# Patient Record
Sex: Male | Born: 1980 | Race: Black or African American | Hispanic: No | Marital: Married | Smoking: Current every day smoker
Health system: Southern US, Community
[De-identification: ages and names within clinical notes are randomized; demographics above are authoritative.]

## PROBLEM LIST (undated history)

## (undated) DIAGNOSIS — M549 Dorsalgia, unspecified: Secondary | ICD-10-CM

## (undated) DIAGNOSIS — Z23 Encounter for immunization: Secondary | ICD-10-CM

## (undated) DIAGNOSIS — R209 Unspecified disturbances of skin sensation: Secondary | ICD-10-CM

## (undated) DIAGNOSIS — G4700 Insomnia, unspecified: Secondary | ICD-10-CM

## (undated) DIAGNOSIS — R29898 Other symptoms and signs involving the musculoskeletal system: Secondary | ICD-10-CM

## (undated) HISTORY — DX: Unspecified disturbances of skin sensation: R20.9

## (undated) HISTORY — DX: Other symptoms and signs involving the musculoskeletal system: R29.898

## (undated) HISTORY — DX: Insomnia, unspecified: G47.00

## (undated) HISTORY — DX: Encounter for immunization: Z23

## (undated) HISTORY — DX: Dorsalgia, unspecified: M54.9

---

## 2006-08-02 ENCOUNTER — Emergency Department (HOSPITAL_COMMUNITY): Admission: EM | Admit: 2006-08-02 | Discharge: 2006-08-02 | Payer: Self-pay | Admitting: *Deleted

## 2007-02-22 ENCOUNTER — Emergency Department (HOSPITAL_COMMUNITY): Admission: EM | Admit: 2007-02-22 | Discharge: 2007-02-22 | Payer: Self-pay | Admitting: Emergency Medicine

## 2007-05-07 ENCOUNTER — Emergency Department (HOSPITAL_COMMUNITY): Admission: EM | Admit: 2007-05-07 | Discharge: 2007-05-07 | Payer: Self-pay | Admitting: Emergency Medicine

## 2007-06-04 ENCOUNTER — Emergency Department (HOSPITAL_COMMUNITY): Admission: EM | Admit: 2007-06-04 | Discharge: 2007-06-04 | Payer: Self-pay | Admitting: Emergency Medicine

## 2007-12-07 ENCOUNTER — Emergency Department (HOSPITAL_COMMUNITY): Admission: EM | Admit: 2007-12-07 | Discharge: 2007-12-07 | Payer: Self-pay | Admitting: Emergency Medicine

## 2009-07-01 ENCOUNTER — Emergency Department (HOSPITAL_COMMUNITY): Admission: EM | Admit: 2009-07-01 | Discharge: 2009-07-01 | Payer: Self-pay | Admitting: Emergency Medicine

## 2009-08-07 ENCOUNTER — Emergency Department (HOSPITAL_COMMUNITY): Admission: EM | Admit: 2009-08-07 | Discharge: 2009-08-08 | Payer: Self-pay | Admitting: Emergency Medicine

## 2009-11-24 ENCOUNTER — Emergency Department (HOSPITAL_COMMUNITY): Admission: EM | Admit: 2009-11-24 | Discharge: 2009-11-24 | Payer: Self-pay | Admitting: Emergency Medicine

## 2010-07-02 LAB — GLUCOSE, CAPILLARY: Glucose-Capillary: 94 mg/dL (ref 70–99)

## 2010-07-07 LAB — DIFFERENTIAL
Basophils Absolute: 0 10*3/uL (ref 0.0–0.1)
Basophils Relative: 0 % (ref 0–1)
Neutro Abs: 5.9 10*3/uL (ref 1.7–7.7)
Neutrophils Relative %: 81 % — ABNORMAL HIGH (ref 43–77)

## 2010-07-07 LAB — CBC
MCHC: 32.6 g/dL (ref 30.0–36.0)
RBC: 4.95 MIL/uL (ref 4.22–5.81)
RDW: 13.1 % (ref 11.5–15.5)

## 2011-01-02 LAB — URINALYSIS, ROUTINE W REFLEX MICROSCOPIC: Specific Gravity, Urine: 1.028

## 2011-01-03 LAB — GC/CHLAMYDIA PROBE AMP, GENITAL
Chlamydia, DNA Probe: NEGATIVE
GC Probe Amp, Genital: NEGATIVE

## 2011-01-03 LAB — URINALYSIS, ROUTINE W REFLEX MICROSCOPIC
Bilirubin Urine: NEGATIVE
Glucose, UA: NEGATIVE
Ketones, ur: NEGATIVE
Nitrite: NEGATIVE
Specific Gravity, Urine: 1.025
pH: 7

## 2011-01-03 LAB — URINE MICROSCOPIC-ADD ON

## 2011-01-07 ENCOUNTER — Emergency Department (HOSPITAL_COMMUNITY)
Admission: EM | Admit: 2011-01-07 | Discharge: 2011-01-10 | Disposition: A | Discharge status: Home / Self Care | Payer: Self-pay | Attending: Internal Medicine | Admitting: Internal Medicine

## 2011-01-07 DIAGNOSIS — R443 Hallucinations, unspecified: Secondary | ICD-10-CM | POA: Insufficient documentation

## 2011-01-07 DIAGNOSIS — R45850 Homicidal ideations: Secondary | ICD-10-CM | POA: Insufficient documentation

## 2011-01-07 DIAGNOSIS — L02619 Cutaneous abscess of unspecified foot: Secondary | ICD-10-CM | POA: Insufficient documentation

## 2011-01-07 LAB — URINALYSIS, ROUTINE W REFLEX MICROSCOPIC
Glucose, UA: NEGATIVE mg/dL
Hgb urine dipstick: NEGATIVE
Ketones, ur: >80 mg/dL — AB
Leukocytes, UA: NEGATIVE
Protein, ur: NEGATIVE mg/dL
Urobilinogen, UA: 1 mg/dL (ref 0.0–1.0)

## 2011-01-07 LAB — DIFFERENTIAL
Basophils Absolute: 0 10*3/uL (ref 0.0–0.1)
Basophils Relative: 0 % (ref 0–1)
Lymphocytes Relative: 7 % — ABNORMAL LOW (ref 12–46)
Neutro Abs: 15.1 10*3/uL — ABNORMAL HIGH (ref 1.7–7.7)
Neutrophils Relative %: 86 % — ABNORMAL HIGH (ref 43–77)

## 2011-01-07 LAB — CBC
HCT: 41 % (ref 39.0–52.0)
Hemoglobin: 14.6 g/dL (ref 13.0–17.0)
RBC: 4.83 MIL/uL (ref 4.22–5.81)

## 2011-01-07 LAB — RAPID URINE DRUG SCREEN, HOSP PERFORMED
Amphetamines: NOT DETECTED
Opiates: NOT DETECTED
Tetrahydrocannabinol: NOT DETECTED

## 2011-01-08 LAB — ETHANOL: Alcohol, Ethyl (B): <11 mg/dL (ref 0–11)

## 2011-01-08 LAB — BASIC METABOLIC PANEL
CO2: 25 mEq/L (ref 19–32)
Chloride: 101 mEq/L (ref 96–112)
Glucose, Bld: 111 mg/dL — ABNORMAL HIGH (ref 70–99)
Potassium: 3.5 mEq/L (ref 3.5–5.1)
Sodium: 137 mEq/L (ref 135–145)

## 2011-01-09 ENCOUNTER — Emergency Department (HOSPITAL_COMMUNITY): Payer: Self-pay

## 2011-01-09 ENCOUNTER — Encounter (HOSPITAL_COMMUNITY): Payer: Self-pay

## 2011-01-09 LAB — URINALYSIS, ROUTINE W REFLEX MICROSCOPIC
Glucose, UA: NEGATIVE mg/dL
Hgb urine dipstick: NEGATIVE
Leukocytes, UA: NEGATIVE
Protein, ur: NEGATIVE mg/dL
Specific Gravity, Urine: 1.018 (ref 1.005–1.030)
pH: 7 (ref 5.0–8.0)

## 2011-01-09 LAB — URINE MICROSCOPIC-ADD ON

## 2011-01-21 LAB — COMPREHENSIVE METABOLIC PANEL
ALT: 23
Calcium: 9
Glucose, Bld: 104 — ABNORMAL HIGH
Sodium: 139
Total Protein: 7.1

## 2011-01-21 LAB — OCCULT BLOOD X 1 CARD TO LAB, STOOL: Fecal Occult Bld: NEGATIVE

## 2011-01-21 LAB — CBC
Hemoglobin: 16.3
MCHC: 34
RDW: 12.1

## 2011-01-21 LAB — URINALYSIS, ROUTINE W REFLEX MICROSCOPIC
Hgb urine dipstick: NEGATIVE
Nitrite: NEGATIVE
Protein, ur: NEGATIVE
Urobilinogen, UA: 0.2

## 2011-01-21 LAB — TYPE AND SCREEN
ABO/RH(D): O POS
Antibody Screen: NEGATIVE

## 2011-01-21 LAB — PROTIME-INR
INR: 0.9
Prothrombin Time: 11.8

## 2011-01-21 LAB — DIFFERENTIAL
Eosinophils Absolute: 0 — ABNORMAL LOW
Lymphs Abs: 0.3 — ABNORMAL LOW
Monocytes Relative: 4
Neutro Abs: 12.8 — ABNORMAL HIGH
Neutrophils Relative %: 91 — ABNORMAL HIGH

## 2013-01-06 ENCOUNTER — Encounter: Payer: Self-pay | Admitting: Neurology

## 2013-01-07 ENCOUNTER — Ambulatory Visit: Payer: 59 | Admitting: Neurology

## 2013-04-04 ENCOUNTER — Encounter (HOSPITAL_COMMUNITY): Payer: Self-pay | Admitting: Emergency Medicine

## 2013-04-04 ENCOUNTER — Emergency Department (HOSPITAL_COMMUNITY)
Admission: EM | Admit: 2013-04-04 | Discharge: 2013-04-05 | Disposition: A | Discharge status: Home / Self Care | Payer: Self-pay | Attending: Emergency Medicine | Admitting: Emergency Medicine

## 2013-04-04 ENCOUNTER — Emergency Department (HOSPITAL_COMMUNITY): Payer: Self-pay

## 2013-04-04 DIAGNOSIS — J209 Acute bronchitis, unspecified: Secondary | ICD-10-CM | POA: Insufficient documentation

## 2013-04-04 DIAGNOSIS — J40 Bronchitis, not specified as acute or chronic: Secondary | ICD-10-CM

## 2013-04-04 DIAGNOSIS — IMO0001 Reserved for inherently not codable concepts without codable children: Secondary | ICD-10-CM | POA: Insufficient documentation

## 2013-04-04 DIAGNOSIS — K2920 Alcoholic gastritis without bleeding: Secondary | ICD-10-CM

## 2013-04-04 DIAGNOSIS — K920 Hematemesis: Secondary | ICD-10-CM | POA: Insufficient documentation

## 2013-04-04 DIAGNOSIS — R319 Hematuria, unspecified: Secondary | ICD-10-CM | POA: Insufficient documentation

## 2013-04-04 DIAGNOSIS — F17200 Nicotine dependence, unspecified, uncomplicated: Secondary | ICD-10-CM | POA: Insufficient documentation

## 2013-04-04 LAB — URINALYSIS, ROUTINE W REFLEX MICROSCOPIC
Bilirubin Urine: NEGATIVE
Glucose, UA: NEGATIVE mg/dL
Hgb urine dipstick: NEGATIVE
Ketones, ur: NEGATIVE mg/dL
Protein, ur: NEGATIVE mg/dL

## 2013-04-04 LAB — CBC WITH DIFFERENTIAL/PLATELET
Basophils Absolute: 0 10*3/uL (ref 0.0–0.1)
Basophils Relative: 0 % (ref 0–1)
Eosinophils Relative: 2 % (ref 0–5)
HCT: 41.2 % (ref 39.0–52.0)
MCHC: 35.4 g/dL (ref 30.0–36.0)
MCV: 88 fL (ref 78.0–100.0)
Monocytes Absolute: 0.5 10*3/uL (ref 0.1–1.0)
RDW: 13.6 % (ref 11.5–15.5)

## 2013-04-04 LAB — COMPREHENSIVE METABOLIC PANEL
AST: 27 U/L (ref 0–37)
Albumin: 3.8 g/dL (ref 3.5–5.2)
BUN: 8 mg/dL (ref 6–23)
CO2: 25 mEq/L (ref 19–32)
Calcium: 9 mg/dL (ref 8.4–10.5)
Creatinine, Ser: 1.06 mg/dL (ref 0.50–1.35)
GFR calc non Af Amer: >90 mL/min (ref >90)

## 2013-04-04 LAB — LIPASE, BLOOD: Lipase: 24 U/L (ref 11–59)

## 2013-04-04 LAB — OCCULT BLOOD, POC DEVICE: Fecal Occult Bld: NEGATIVE

## 2013-04-04 MED ORDER — ONDANSETRON HCL 4 MG/2ML IJ SOLN
4.0000 mg | Freq: Once | INTRAMUSCULAR | Status: AC
  Administered 2013-04-04: 4 mg via INTRAVENOUS
  Filled 2013-04-04: qty 2

## 2013-04-04 MED ORDER — PANTOPRAZOLE SODIUM 40 MG IV SOLR
40.0000 mg | Freq: Once | INTRAVENOUS | Status: AC
  Administered 2013-04-04: 40 mg via INTRAVENOUS
  Filled 2013-04-04: qty 40

## 2013-04-04 MED ORDER — SODIUM CHLORIDE 0.9 % IV SOLN
Freq: Once | INTRAVENOUS | Status: AC
  Administered 2013-04-04: 21:00:00 via INTRAVENOUS

## 2013-04-04 NOTE — ED Notes (Signed)
Pt complains of a cough and congestion for two weeks, he states that he's had blood in his urine a few times and has also vomited blood

## 2013-04-05 MED ORDER — ACETAMINOPHEN-CODEINE 120-12 MG/5ML PO SOLN
10.0000 mL | Freq: Once | ORAL | Status: AC
  Administered 2013-04-05: 10 mL via ORAL
  Filled 2013-04-05: qty 10

## 2013-04-05 MED ORDER — ACETAMINOPHEN-CODEINE 120-12 MG/5ML PO SOLN: 10.0000 mL | ORAL | Status: DC | PRN

## 2013-04-05 MED ORDER — AZITHROMYCIN 250 MG PO TABS
500.0000 mg | ORAL_TABLET | Freq: Once | ORAL | Status: AC
  Administered 2013-04-05: 500 mg via ORAL
  Filled 2013-04-05: qty 2

## 2013-04-05 MED ORDER — ALBUTEROL SULFATE HFA 108 (90 BASE) MCG/ACT IN AERS
2.0000 | INHALATION_SPRAY | Freq: Once | RESPIRATORY_TRACT | Status: AC
  Administered 2013-04-05: 2 via RESPIRATORY_TRACT
  Filled 2013-04-05: qty 6.7

## 2013-04-05 MED ORDER — AZITHROMYCIN 250 MG PO TABS: ORAL_TABLET | ORAL | Status: DC

## 2013-04-05 MED ORDER — PANTOPRAZOLE SODIUM 20 MG PO TBEC: 20.0000 mg | DELAYED_RELEASE_TABLET | Freq: Every day | ORAL | Status: DC

## 2013-04-05 NOTE — ED Provider Notes (Signed)
CSN: [id number]     Arrival date & time 04/04/13  2027 History   First MD Initiated Contact with Patient 04/04/13 2114     Chief Complaint  Patient presents with  . Cough  . Nasal Congestion  . Hematuria  . Abdominal Pain   (Consider location/radiation/quality/duration/timing/severity/associated sxs/prior Treatment) HPI Comments: Patient here with multiple complaints - first is cough - he reports cough and shortness of breath for the past 2 weeks - he denies fever, chills, but states continues with cough.  He reports sputum is thick and green.  He states smokes 1 ppd but that he is trying to quit.  He is also here with several months of epigastric pain with occasional vomiting of blood streaks.  He states that he is a daily drinker (1 pint of vodka and a six pack of beer).  He states that he drinks because of his back pain and insomnia.  He reports he is supposed to head back to [place] for back surgery soon.  He reports no rectal bleeding or dark or tarry stools.  He reports also occasionally noticing blood when he urinates but he denies pain, urethral discharge, testicular pain.  Patient is a 32 y.o. male presenting with cough, hematuria, and abdominal pain. The history is provided by the patient and the spouse. No language interpreter was used.  Cough Cough characteristics:  Productive Sputum characteristics:  Green Severity:  Moderate Onset quality:  Gradual Duration:  2 weeks Timing:  Constant Progression:  Worsening Chronicity:  Recurrent Smoker: yes   Relieved by:  Nothing Worsened by:  Nothing tried Ineffective treatments:  None tried Associated symptoms: myalgias and shortness of breath   Associated symptoms: no chest pain, no chills, no ear fullness, no ear pain, no fever, no headaches, no rhinorrhea, no sore throat and no wheezing   Hematuria Associated symptoms include abdominal pain, coughing, myalgias, nausea and vomiting. Pertinent negatives include no anorexia, chest pain,  chills, fever, headaches or sore throat.  Abdominal Pain Pain location:  Epigastric Pain quality: bloating, burning and sharp   Pain radiates to:  Does not radiate Pain severity:  Moderate Onset quality:  Gradual Duration:  4 weeks Timing:  Constant Progression:  Worsening Chronicity:  New Context: alcohol use   Relieved by:  Nothing Worsened by:  Nothing tried Ineffective treatments:  None tried Associated symptoms: cough, hematemesis, hematuria, nausea, shortness of breath and vomiting   Associated symptoms: no anorexia, no chest pain, no chills, no constipation, no diarrhea, no dysuria, no fever and no sore throat   Risk factors: alcohol abuse     Past Medical History  Diagnosis Date  . Backache, unspecified   . Insomnia, unspecified   . Other musculoskeletal symptoms referable to limbs(729.89)   . Disturbance of skin sensation   . Need for prophylactic vaccination with combined diphtheria-tetanus-pertussis (DTP) vaccine    History reviewed. No pertinent past surgical history. History reviewed. No pertinent family history. History  Substance Use Topics  . Smoking status: Never Smoker   . Smokeless tobacco: Never Used  . Alcohol Use: No    Review of Systems  Constitutional: Negative for fever and chills.  HENT: Negative for ear pain, rhinorrhea and sore throat.   Respiratory: Positive for cough and shortness of breath. Negative for wheezing.   Cardiovascular: Negative for chest pain.  Gastrointestinal: Positive for nausea, vomiting, abdominal pain and hematemesis. Negative for diarrhea, constipation and anorexia.  Genitourinary: Positive for hematuria. Negative for dysuria.  Musculoskeletal: Positive for myalgias.  Neurological: Negative for headaches.  All other systems reviewed and are negative.    Allergies  Review of patient's allergies indicates no known allergies.  Home Medications   Current Outpatient Rx  Name  Route  Sig  Dispense  Refill  .  cyclobenzaprine (FLEXERIL) 10 MG tablet   Oral   Take 10 mg by mouth 3 (three) times daily as needed for muscle spasms.          BP 130/58  Pulse 72  Temp(Src) 98.4 F (36.9 C) (Oral)  Resp 18  Ht 6\' 1"  (1.854 m)  Wt 240 lb (108.863 kg)  BMI 31.67 kg/m2  SpO2 97% Physical Exam  Nursing note and vitals reviewed. Constitutional: He is oriented to person, place, and time. He appears well-developed and well-nourished. No distress.  HENT:  Head: Normocephalic and atraumatic.  Right Ear: External ear normal.  Left Ear: External ear normal.  Nose: Nose normal.  Mouth/Throat: Oropharynx is clear and moist. No oropharyngeal exudate.  Eyes: Conjunctivae are normal. Pupils are equal, round, and reactive to light. No scleral icterus.  Neck: Normal range of motion. Neck supple.  Cardiovascular: Normal rate, regular rhythm and normal heart sounds.  Exam reveals no gallop and no friction rub.   No murmur heard. Pulmonary/Chest: Effort normal and breath sounds normal. No respiratory distress. He has no wheezes. He has no rales. He exhibits no tenderness.  Abdominal: Soft. Bowel sounds are normal. He exhibits no distension and no mass. There is tenderness in the epigastric area. There is no rebound and no guarding.    Genitourinary: Rectum normal. Guaiac negative stool.  Musculoskeletal: Normal range of motion. He exhibits no edema and no tenderness.  Lymphadenopathy:    He has no cervical adenopathy.  Neurological: He is alert and oriented to person, place, and time. He exhibits normal muscle tone. Coordination normal.  Skin: Skin is warm and dry. No rash noted. No erythema. No pallor.  Psychiatric: He has a normal mood and affect. His behavior is normal. Judgment and thought content normal.    ED Course  Procedures (including critical care time) Labs Review Labs Reviewed  COMPREHENSIVE METABOLIC PANEL - Abnormal; Notable for the following:    Glucose, Bld 115 (*)    Total Bilirubin  0.1 (*)    All other components within normal limits  URINALYSIS, ROUTINE W REFLEX MICROSCOPIC  CBC WITH DIFFERENTIAL  LIPASE, BLOOD  OCCULT BLOOD, POC DEVICE   Imaging Review Dg Chest 2 View  04/04/2013   CLINICAL DATA:  Cough, congestion, shortness of breath  EXAM: CHEST  2 VIEW  COMPARISON:  08/02/2006  FINDINGS: Grossly unchanged cardiac silhouette and mediastinal contours given slightly reduced lung volumes. There is mild diffuse slightly nodular thickening of the pulmonary interstitium. No focal airspace opacities. No pleural effusion or pneumothorax. No definite evidence of edema. No acute osseus abnormalities. Post cholecystectomy.  IMPRESSION: Slightly reduced lung volumes with findings suggestive of suggestive of airways disease. No focal airspace opacities to suggest pneumonia.   Electronically Signed   By: Simonne Come M.D.   On: 04/04/2013 21:57    EKG Interpretation   None      Results for orders placed during the hospital encounter of 04/04/13  URINALYSIS, ROUTINE W REFLEX MICROSCOPIC      Result Value Range   Color, Urine YELLOW  YELLOW   APPearance CLEAR  CLEAR   Specific Gravity, Urine 1.028  1.005 - 1.030   pH 6.0  5.0 - 8.0  Glucose, UA NEGATIVE  NEGATIVE mg/dL   Hgb urine dipstick NEGATIVE  NEGATIVE   Bilirubin Urine NEGATIVE  NEGATIVE   Ketones, ur NEGATIVE  NEGATIVE mg/dL   Protein, ur NEGATIVE  NEGATIVE mg/dL   Urobilinogen, UA 0.2  0.0 - 1.0 mg/dL   Nitrite NEGATIVE  NEGATIVE   Leukocytes, UA NEGATIVE  NEGATIVE  CBC WITH DIFFERENTIAL      Result Value Range   WBC 10.3  4.0 - 10.5 K/uL   RBC 4.68  4.22 - 5.81 MIL/uL   Hemoglobin 14.6  13.0 - 17.0 g/dL   HCT 16.1  09.6 - 04.5 %   MCV 88.0  78.0 - 100.0 fL   MCH 31.2  26.0 - 34.0 pg   MCHC 35.4  30.0 - 36.0 g/dL   RDW 40.9  81.1 - 91.4 %   Platelets 287  150 - 400 K/uL   Neutrophils Relative % 63  43 - 77 %   Neutro Abs 6.5  1.7 - 7.7 K/uL   Lymphocytes Relative 30  12 - 46 %   Lymphs Abs 3.1  0.7  - 4.0 K/uL   Monocytes Relative 5  3 - 12 %   Monocytes Absolute 0.5  0.1 - 1.0 K/uL   Eosinophils Relative 2  0 - 5 %   Eosinophils Absolute 0.2  0.0 - 0.7 K/uL   Basophils Relative 0  0 - 1 %   Basophils Absolute 0.0  0.0 - 0.1 K/uL  COMPREHENSIVE METABOLIC PANEL      Result Value Range   Sodium 140  135 - 145 mEq/L   Potassium 3.7  3.5 - 5.1 mEq/L   Chloride 101  96 - 112 mEq/L   CO2 25  19 - 32 mEq/L   Glucose, Bld 115 (*) 70 - 99 mg/dL   BUN 8  6 - 23 mg/dL   Creatinine, Ser 7.82  0.50 - 1.35 mg/dL   Calcium 9.0  8.4 - 95.6 mg/dL   Total Protein 7.5  6.0 - 8.3 g/dL   Albumin 3.8  3.5 - 5.2 g/dL   AST 27  0 - 37 U/L   ALT 19  0 - 53 U/L   Alkaline Phosphatase 91  39 - 117 U/L   Total Bilirubin 0.1 (*) 0.3 - 1.2 mg/dL   GFR calc non Af Amer >90  >90 mL/min   GFR calc Af Amer >90  >90 mL/min  LIPASE, BLOOD      Result Value Range   Lipase 24  11 - 59 U/L  OCCULT BLOOD, POC DEVICE      Result Value Range   Fecal Occult Bld NEGATIVE  NEGATIVE   Dg Chest 2 View  04/04/2013   CLINICAL DATA:  Cough, congestion, shortness of breath  EXAM: CHEST  2 VIEW  COMPARISON:  08/02/2006  FINDINGS: Grossly unchanged cardiac silhouette and mediastinal contours given slightly reduced lung volumes. There is mild diffuse slightly nodular thickening of the pulmonary interstitium. No focal airspace opacities. No pleural effusion or pneumothorax. No definite evidence of edema. No acute osseus abnormalities. Post cholecystectomy.  IMPRESSION: Slightly reduced lung volumes with findings suggestive of suggestive of airways disease. No focal airspace opacities to suggest pneumonia.   Electronically Signed   By: Simonne Come M.D.   On: 04/04/2013 21:57    Medications  ondansetron (ZOFRAN) injection 4 mg (4 mg Intravenous Given 04/04/13 2114)  0.9 %  sodium chloride infusion ( Intravenous Stopped 04/04/13 2232)  pantoprazole (  PROTONIX) injection 40 mg (40 mg Intravenous Given 04/04/13 2140)  albuterol  (PROVENTIL HFA;VENTOLIN HFA) 108 (90 BASE) MCG/ACT inhaler 2 puff (2 puffs Inhalation Given 04/05/13 0019)  azithromycin (ZITHROMAX) tablet 500 mg (500 mg Oral Given 04/05/13 0018)  acetaminophen-codeine 120-12 MG/5ML solution 10 mL (10 mLs Oral Given 04/05/13 0018)     MDM  Bronchitis Alcoholic gastritis  Patient here with mutliple complaints including epigastric pain, vomiting, hematemesis, cough and chest congestion.  Likely with bronchitis, no chest pain, likely with alcoholic gastritis - patient does not wish to quit drinking, he is medically stable at this time and I do not believe that further testing is warranted.  He will be discharged home on antibiotics, inhaler, cough medication and a PPI.   Izola Price Marisue Humble, [place] 04/05/13 [phone]

## 2013-04-05 NOTE — ED Provider Notes (Signed)
Medical screening examination/treatment/procedure(s) were performed by non-physician practitioner and as supervising physician I was immediately available for consultation/collaboration.  EKG Interpretation   None         Lyanne Co, MD 04/05/13 2105

## 2013-06-17 ENCOUNTER — Telehealth: Payer: Self-pay | Admitting: Neurology

## 2013-06-17 NOTE — Telephone Encounter (Signed)
Called patient to try r/s appointment from 01/07/13 his home number voice message was for Eston EstersChris Brown, tried calling Emergency contact, Fabiola BackerBrianna Lynn, was told it was the wrong number

## 2020-07-30 ENCOUNTER — Other Ambulatory Visit (HOSPITAL_COMMUNITY): Payer: Self-pay | Admitting: Neurosurgery

## 2020-07-30 DIAGNOSIS — I10 Essential (primary) hypertension: Secondary | ICD-10-CM | POA: Diagnosis present

## 2020-07-30 DIAGNOSIS — R29898 Other symptoms and signs involving the musculoskeletal system: Secondary | ICD-10-CM

## 2020-07-30 HISTORY — DX: Essential (primary) hypertension: I10

## 2020-07-31 ENCOUNTER — Encounter (HOSPITAL_COMMUNITY): Payer: Self-pay

## 2020-07-31 ENCOUNTER — Ambulatory Visit (HOSPITAL_COMMUNITY): Payer: Self-pay | Attending: Neurosurgery

## 2020-11-19 DIAGNOSIS — G629 Polyneuropathy, unspecified: Secondary | ICD-10-CM

## 2020-11-19 HISTORY — DX: Polyneuropathy, unspecified: G62.9

## 2020-11-22 ENCOUNTER — Other Ambulatory Visit: Payer: Self-pay

## 2020-11-22 ENCOUNTER — Emergency Department (HOSPITAL_BASED_OUTPATIENT_CLINIC_OR_DEPARTMENT_OTHER)
Admission: EM | Admit: 2020-11-22 | Discharge: 2020-11-22 | Disposition: A | Discharge status: Home / Self Care | Payer: Medicaid Other | Attending: Emergency Medicine | Admitting: Emergency Medicine

## 2020-11-22 ENCOUNTER — Encounter (HOSPITAL_BASED_OUTPATIENT_CLINIC_OR_DEPARTMENT_OTHER): Payer: Self-pay | Admitting: Emergency Medicine

## 2020-11-22 DIAGNOSIS — F17210 Nicotine dependence, cigarettes, uncomplicated: Secondary | ICD-10-CM | POA: Insufficient documentation

## 2020-11-22 DIAGNOSIS — R631 Polydipsia: Secondary | ICD-10-CM | POA: Diagnosis present

## 2020-11-22 DIAGNOSIS — E119 Type 2 diabetes mellitus without complications: Secondary | ICD-10-CM | POA: Insufficient documentation

## 2020-11-22 LAB — URINALYSIS, ROUTINE W REFLEX MICROSCOPIC
Bilirubin Urine: NEGATIVE
Glucose, UA: >1000 mg/dL — AB
Hgb urine dipstick: NEGATIVE
Ketones, ur: 40 mg/dL — AB
Leukocytes,Ua: NEGATIVE
Nitrite: NEGATIVE
Specific Gravity, Urine: >1.046 — ABNORMAL HIGH (ref 1.005–1.030)
pH: 5.5 (ref 5.0–8.0)

## 2020-11-22 LAB — COMPREHENSIVE METABOLIC PANEL
ALT: 44 U/L (ref 0–44)
AST: 25 U/L (ref 15–41)
Albumin: 4.5 g/dL (ref 3.5–5.0)
Alkaline Phosphatase: 117 U/L (ref 38–126)
Anion gap: 12 (ref 5–15)
BUN: 11 mg/dL (ref 6–20)
CO2: 23 mmol/L (ref 22–32)
Calcium: 10.2 mg/dL (ref 8.9–10.3)
Chloride: 98 mmol/L (ref 98–111)
Creatinine, Ser: 0.91 mg/dL (ref 0.61–1.24)
GFR, Estimated: >60 mL/min (ref >60)
Glucose, Bld: 338 mg/dL — ABNORMAL HIGH (ref 70–99)
Potassium: 3.8 mmol/L (ref 3.5–5.1)
Sodium: 133 mmol/L — ABNORMAL LOW (ref 135–145)
Total Bilirubin: 0.4 mg/dL (ref 0.3–1.2)
Total Protein: 7.9 g/dL (ref 6.5–8.1)

## 2020-11-22 LAB — CBC
HCT: 44.4 % (ref 39.0–52.0)
Hemoglobin: 15.3 g/dL (ref 13.0–17.0)
MCH: 29.5 pg (ref 26.0–34.0)
MCHC: 34.5 g/dL (ref 30.0–36.0)
MCV: 85.7 fL (ref 80.0–100.0)
Platelets: 262 10*3/uL (ref 150–400)
RBC: 5.18 MIL/uL (ref 4.22–5.81)
RDW: 12.7 % (ref 11.5–15.5)
WBC: 8.8 10*3/uL (ref 4.0–10.5)
nRBC: 0 % (ref 0.0–0.2)

## 2020-11-22 LAB — CBG MONITORING, ED
Glucose-Capillary: 331 mg/dL — ABNORMAL HIGH (ref 70–99)
Glucose-Capillary: 386 mg/dL — ABNORMAL HIGH (ref 70–99)
Glucose-Capillary: 407 mg/dL — ABNORMAL HIGH (ref 70–99)

## 2020-11-22 LAB — HEMOGLOBIN A1C
Hgb A1c MFr Bld: 10.6 % — ABNORMAL HIGH (ref 4.8–5.6)
Mean Plasma Glucose: 257.52 mg/dL

## 2020-11-22 MED ORDER — METFORMIN HCL 500 MG PO TABS: 500.0000 mg | ORAL_TABLET | Freq: Two times a day (BID) | ORAL | 0 refills | Status: DC

## 2020-11-22 MED ORDER — METFORMIN HCL 500 MG PO TABS
500.0000 mg | ORAL_TABLET | Freq: Once | ORAL | Status: AC
  Administered 2020-11-22: 500 mg via ORAL
  Filled 2020-11-22: qty 1

## 2020-11-22 NOTE — Discharge Instructions (Addendum)
1.  Tomorrow start trying to get established with a primary care doctor to manage and further evaluate your diabetes.  There is a referral number in your discharge instructions and you may use that. 2.  You have been given extensive information for new onset diabetes.  Try to follow dietary instructions.  Start metformin as prescribed.  Get a home monitoring kit and start writing down your blood sugar results twice a day. 3.  Return to the emergency department immediately if you get confusion, severe weakness, nausea and vomiting or other concerning symptoms.  You might get low blood sugar with medication, try to monitor with your home monitor but if you get significant lightheadedness dizziness or shaking, eat a carbohydrate rich snack like peanut butter and orange juice.  Come to the emergency department for evaluation.

## 2020-11-22 NOTE — ED Triage Notes (Signed)
Has had dry mouth, extreme thirst and fatigue x 2 weeks , went to [facility] today and was told he has high glucose 381

## 2020-11-22 NOTE — ED Provider Notes (Signed)
[facility] EMERGENCY DEPT Provider Note   CSN: [id number] Arrival date & time: 11/22/20  1437     History Chief Complaint  Kerry Vaughn presents with   Hyperglycemia    Kerry Vaughn is a 40 y.o. male.  HPI Kerry Vaughn reports for almost 2 weeks now he has been experiencing increased thirst, he reports his vision has been slightly blurred, he has been urinating frequently.  Kerry Vaughn reports a history of diagnosis of borderline diabetes.  With his symptoms, he did go to urgent care and his blood sugar was in the 300s.  He was advised to come to the emergency department.  Kerry Vaughn's wife also had a home monitoring kit for A1c and identified an elevated A1c.  Other than borderline diabetes, he denies any known history of other medical problems.  Kerry Vaughn denies recent fevers chills, no cough no chest pain no shortness of breath.  No vomiting or diarrhea.  Kerry Vaughn has been trying to increase his water intake and decrease sugared beverages. Kerry Vaughn does smoke a pack per day.  He has low to moderate alcohol use.  Denies other drugs of abuse.    Past Medical History:  Diagnosis Date   Backache, unspecified    Disturbance of skin sensation    Insomnia, unspecified    Need for prophylactic vaccination with combined diphtheria-tetanus-pertussis (DTP) vaccine    Other musculoskeletal symptoms referable to limbs(729.89)     There are no problems to display for this Kerry Vaughn.   No past surgical history on file.     No family history on file.  Social History   Tobacco Use   Smoking status: Every Day    Types: Cigarettes    Passive exposure: Current   Smokeless tobacco: Never  Vaping Use   Vaping Use: Never used  Substance Use Topics   Alcohol use: Yes   Drug use: No    Home Medications Prior to Admission medications   Medication Sig Start Date End Date Taking? Authorizing Provider  metFORMIN (GLUCOPHAGE) 500 MG tablet Take 1 tablet (500 mg total) by mouth 2 (two) times daily  with a meal. 11/22/20  Yes Carra Brindley, Jeannie Done, MD  acetaminophen-codeine 120-12 MG/5ML solution Take 10 mLs by mouth every 4 (four) hours as needed (cough). 04/05/13   Ignacia Felling, PA-C  azithromycin (ZITHROMAX) 250 MG tablet 1 tablet po daily 04/05/13   Ignacia Felling, PA-C  cyclobenzaprine (FLEXERIL) 10 MG tablet Take 10 mg by mouth 3 (three) times daily as needed for muscle spasms.    [provider]  pantoprazole (PROTONIX) 20 MG tablet Take 1 tablet (20 mg total) by mouth daily. 04/05/13   Ignacia Felling, PA-C    Allergies    Kerry Vaughn has no known allergies.  Review of Systems   Review of Systems 10 systems reviewed negative except as per HPI Physical Exam Updated Vital Signs BP (!) 103/56   Pulse 86   Temp 98.4 F (36.9 C)   Resp 20   SpO2 98%   Physical Exam Constitutional:      Comments: Alert nontoxic clinically well in appearance.  No respiratory distress  HENT:     Head: Normocephalic and atraumatic.     Mouth/Throat:     Mouth: Mucous membranes are moist.     Pharynx: Oropharynx is clear.  Eyes:     Extraocular Movements: Extraocular movements intact.     Conjunctiva/sclera: Conjunctivae normal.  Cardiovascular:     Rate and Rhythm: Normal rate and regular rhythm.  Pulmonary:  Effort: Pulmonary effort is normal.     Breath sounds: Normal breath sounds.  Abdominal:     General: There is no distension.     Palpations: Abdomen is soft.     Tenderness: There is no abdominal tenderness. There is no guarding.  Musculoskeletal:        General: No swelling or tenderness. Normal range of motion.     Right lower leg: No edema.     Left lower leg: No edema.  Skin:    General: Skin is warm and dry.  Neurological:     General: No focal deficit present.     Mental Status: He is oriented to person, place, and time.     Motor: No weakness.     Coordination: Coordination normal.  Psychiatric:        Mood and Affect: Mood normal.    ED Results /  Procedures / Treatments   Labs (all labs ordered are listed, but only abnormal results are displayed) Labs Reviewed  URINALYSIS, ROUTINE W REFLEX MICROSCOPIC - Abnormal; Notable for the following components:      Result Value   Specific Gravity, Urine >1.046 (*)    Glucose, UA >1,000 (*)    Ketones, ur 40 (*)    Protein, ur TRACE (*)    All other components within normal limits  COMPREHENSIVE METABOLIC PANEL - Abnormal; Notable for the following components:   Sodium 133 (*)    Glucose, Bld 338 (*)    All other components within normal limits  HEMOGLOBIN A1C - Abnormal; Notable for the following components:   Hgb A1c MFr Bld 10.6 (*)    All other components within normal limits  CBG MONITORING, ED - Abnormal; Notable for the following components:   Glucose-Capillary 331 (*)    All other components within normal limits  CBC  CBG MONITORING, ED    EKG None  Radiology No results found.  Procedures Procedures   Medications Ordered in ED Medications  metFORMIN (GLUCOPHAGE) tablet 500 mg (has no administration in time range)    ED Course  I have reviewed the triage vital signs and the nursing notes.  Pertinent labs & imaging results that were available during my care of the Kerry Vaughn were reviewed by me and considered in my medical decision making (see chart for details).    MDM Rules/Calculators/A&P                           Kerry Vaughn presents with new onset diabetes.  He has classic symptoms.  Kerry Vaughn's mental status is clear.  No signs of DKA or hyperosmolar state.  At this time plan will be to start on metformin.  Kerry Vaughn is counseled on following dietary instructions per discharge plan.  He is also aware that he needs close follow-up for extensive diabetes related health care planning and evaluation.  Kerry Vaughn also counseled to quit smoking.  He and his wife voiced understanding.  Advised they will be able to get a home CBG monitoring kit.  Return precautions reviewed Final  Clinical Impression(s) / ED Diagnoses Final diagnoses:  New onset type 2 diabetes mellitus ([place])    Rx / DC Orders ED Discharge Orders          Ordered    metFORMIN (GLUCOPHAGE) 500 MG tablet  2 times daily with meals        11/22/20 1910             Nethaniel Mattie,  Jeannie Done, MD 11/22/20 1912

## 2021-03-27 ENCOUNTER — Observation Stay (HOSPITAL_BASED_OUTPATIENT_CLINIC_OR_DEPARTMENT_OTHER)
Admission: EM | Admit: 2021-03-27 | Discharge: 2021-03-28 | Disposition: A | Discharge status: Home / Self Care | Payer: Medicaid Other | Attending: Emergency Medicine | Admitting: Emergency Medicine

## 2021-03-27 ENCOUNTER — Encounter (HOSPITAL_BASED_OUTPATIENT_CLINIC_OR_DEPARTMENT_OTHER): Payer: Self-pay

## 2021-03-27 ENCOUNTER — Other Ambulatory Visit: Payer: Self-pay

## 2021-03-27 ENCOUNTER — Observation Stay (HOSPITAL_COMMUNITY): Payer: Medicaid Other | Admitting: Certified Registered Nurse Anesthetist

## 2021-03-27 ENCOUNTER — Emergency Department (HOSPITAL_BASED_OUTPATIENT_CLINIC_OR_DEPARTMENT_OTHER): Payer: Medicaid Other

## 2021-03-27 ENCOUNTER — Encounter (HOSPITAL_COMMUNITY)
Admission: EM | Disposition: A | Discharge status: Home / Self Care | Payer: Self-pay | Source: Home / Self Care | Attending: Emergency Medicine

## 2021-03-27 DIAGNOSIS — Z79899 Other long term (current) drug therapy: Secondary | ICD-10-CM | POA: Insufficient documentation

## 2021-03-27 DIAGNOSIS — K603 Anal fistula: Secondary | ICD-10-CM | POA: Diagnosis not present

## 2021-03-27 DIAGNOSIS — Z794 Long term (current) use of insulin: Secondary | ICD-10-CM | POA: Diagnosis not present

## 2021-03-27 DIAGNOSIS — F17210 Nicotine dependence, cigarettes, uncomplicated: Secondary | ICD-10-CM | POA: Insufficient documentation

## 2021-03-27 DIAGNOSIS — K610 Anal abscess: Secondary | ICD-10-CM

## 2021-03-27 DIAGNOSIS — K6139 Other ischiorectal abscess: Secondary | ICD-10-CM | POA: Diagnosis present

## 2021-03-27 DIAGNOSIS — I10 Essential (primary) hypertension: Secondary | ICD-10-CM | POA: Insufficient documentation

## 2021-03-27 DIAGNOSIS — E119 Type 2 diabetes mellitus without complications: Secondary | ICD-10-CM

## 2021-03-27 DIAGNOSIS — Z20822 Contact with and (suspected) exposure to covid-19: Secondary | ICD-10-CM | POA: Diagnosis not present

## 2021-03-27 DIAGNOSIS — E871 Hypo-osmolality and hyponatremia: Secondary | ICD-10-CM

## 2021-03-27 DIAGNOSIS — Z7984 Long term (current) use of oral hypoglycemic drugs: Secondary | ICD-10-CM | POA: Insufficient documentation

## 2021-03-27 DIAGNOSIS — E1165 Type 2 diabetes mellitus with hyperglycemia: Secondary | ICD-10-CM | POA: Insufficient documentation

## 2021-03-27 DIAGNOSIS — G629 Polyneuropathy, unspecified: Secondary | ICD-10-CM

## 2021-03-27 DIAGNOSIS — E669 Obesity, unspecified: Secondary | ICD-10-CM | POA: Diagnosis present

## 2021-03-27 HISTORY — PX: INCISION AND DRAINAGE PERIRECTAL ABSCESS: SHX1804

## 2021-03-27 HISTORY — DX: Type 2 diabetes mellitus without complications: E11.9

## 2021-03-27 LAB — BASIC METABOLIC PANEL
Anion gap: 11 (ref 5–15)
BUN: 9 mg/dL (ref 6–20)
CO2: 24 mmol/L (ref 22–32)
Calcium: 9.4 mg/dL (ref 8.9–10.3)
Chloride: 96 mmol/L — ABNORMAL LOW (ref 98–111)
Creatinine, Ser: 0.86 mg/dL (ref 0.61–1.24)
GFR, Estimated: >60 mL/min (ref >60)
Glucose, Bld: 464 mg/dL — ABNORMAL HIGH (ref 70–99)
Potassium: 3.5 mmol/L (ref 3.5–5.1)
Sodium: 131 mmol/L — ABNORMAL LOW (ref 135–145)

## 2021-03-27 LAB — CBC WITH DIFFERENTIAL/PLATELET
Abs Immature Granulocytes: 0.04 10*3/uL (ref 0.00–0.07)
Basophils Absolute: 0 10*3/uL (ref 0.0–0.1)
Basophils Relative: 0 %
Eosinophils Absolute: 0 10*3/uL (ref 0.0–0.5)
Eosinophils Relative: 0 %
HCT: 42.4 % (ref 39.0–52.0)
Hemoglobin: 14.6 g/dL (ref 13.0–17.0)
Immature Granulocytes: 0 %
Lymphocytes Relative: 13 %
Lymphs Abs: 1.6 10*3/uL (ref 0.7–4.0)
MCH: 29 pg (ref 26.0–34.0)
MCHC: 34.4 g/dL (ref 30.0–36.0)
MCV: 84.3 fL (ref 80.0–100.0)
Monocytes Absolute: 1 10*3/uL (ref 0.1–1.0)
Monocytes Relative: 8 %
Neutro Abs: 10.1 10*3/uL — ABNORMAL HIGH (ref 1.7–7.7)
Neutrophils Relative %: 79 %
Platelets: 250 10*3/uL (ref 150–400)
RBC: 5.03 MIL/uL (ref 4.22–5.81)
RDW: 12.9 % (ref 11.5–15.5)
WBC: 12.8 10*3/uL — ABNORMAL HIGH (ref 4.0–10.5)
nRBC: 0 % (ref 0.0–0.2)

## 2021-03-27 LAB — RESP PANEL BY RT-PCR (FLU A&B, COVID) ARPGX2
Influenza A by PCR: NEGATIVE
Influenza B by PCR: NEGATIVE
SARS Coronavirus 2 by RT PCR: NEGATIVE

## 2021-03-27 LAB — CBG MONITORING, ED: Glucose-Capillary: 268 mg/dL — ABNORMAL HIGH (ref 70–99)

## 2021-03-27 LAB — GLUCOSE, CAPILLARY
Glucose-Capillary: 169 mg/dL — ABNORMAL HIGH (ref 70–99)
Glucose-Capillary: 204 mg/dL — ABNORMAL HIGH (ref 70–99)

## 2021-03-27 SURGERY — INCISION AND DRAINAGE, ABSCESS, PERIRECTAL
Anesthesia: General

## 2021-03-27 MED ORDER — PROPOFOL 10 MG/ML IV BOLUS
INTRAVENOUS | Status: AC
  Filled 2021-03-27: qty 20

## 2021-03-27 MED ORDER — LACTATED RINGERS IV SOLN: INTRAVENOUS | Status: DC

## 2021-03-27 MED ORDER — OXYCODONE HCL 5 MG PO TABS
15.0000 mg | ORAL_TABLET | Freq: Four times a day (QID) | ORAL | Status: DC | PRN
  Administered 2021-03-27 – 2021-03-28 (×4): 15 mg via ORAL
  Filled 2021-03-27 (×5): qty 3

## 2021-03-27 MED ORDER — IOHEXOL 300 MG/ML  SOLN
100.0000 mL | Freq: Once | INTRAMUSCULAR | Status: AC | PRN
  Administered 2021-03-27: 06:00:00 100 mL via INTRAVENOUS

## 2021-03-27 MED ORDER — PIPERACILLIN-TAZOBACTAM 3.375 G IVPB
3.3750 g | Freq: Three times a day (TID) | INTRAVENOUS | Status: DC
  Administered 2021-03-27 – 2021-03-28 (×3): 3.375 g via INTRAVENOUS
  Filled 2021-03-27 (×4): qty 50

## 2021-03-27 MED ORDER — ONDANSETRON HCL 4 MG/2ML IJ SOLN: 4.0000 mg | Freq: Once | INTRAMUSCULAR | Status: DC | PRN

## 2021-03-27 MED ORDER — MORPHINE SULFATE (PF) 4 MG/ML IV SOLN
4.0000 mg | Freq: Once | INTRAVENOUS | Status: AC
  Administered 2021-03-27: 06:00:00 4 mg via INTRAVENOUS
  Filled 2021-03-27: qty 1

## 2021-03-27 MED ORDER — HYDROMORPHONE HCL 1 MG/ML IJ SOLN
0.5000 mg | Freq: Once | INTRAMUSCULAR | Status: AC
  Administered 2021-03-27: 09:00:00 0.5 mg via INTRAVENOUS
  Filled 2021-03-27: qty 1

## 2021-03-27 MED ORDER — INSULIN ASPART 100 UNIT/ML IJ SOLN
0.0000 [IU] | Freq: Three times a day (TID) | INTRAMUSCULAR | Status: DC
  Administered 2021-03-27: 13:00:00 5 [IU] via SUBCUTANEOUS
  Filled 2021-03-27: qty 0.09

## 2021-03-27 MED ORDER — ONDANSETRON HCL 4 MG/2ML IJ SOLN
4.0000 mg | Freq: Once | INTRAMUSCULAR | Status: AC
  Administered 2021-03-27: 09:00:00 4 mg via INTRAVENOUS
  Filled 2021-03-27: qty 2

## 2021-03-27 MED ORDER — FENTANYL CITRATE (PF) 100 MCG/2ML IJ SOLN
INTRAMUSCULAR | Status: AC
  Filled 2021-03-27: qty 2

## 2021-03-27 MED ORDER — PIPERACILLIN-TAZOBACTAM 3.375 G IVPB 30 MIN: 3.3750 g | Freq: Three times a day (TID) | INTRAVENOUS | Status: DC

## 2021-03-27 MED ORDER — ONDANSETRON HCL 4 MG/2ML IJ SOLN
INTRAMUSCULAR | Status: DC | PRN
  Administered 2021-03-27: 4 mg via INTRAVENOUS

## 2021-03-27 MED ORDER — HYDROMORPHONE HCL 1 MG/ML IJ SOLN: 0.2500 mg | INTRAMUSCULAR | Status: DC | PRN

## 2021-03-27 MED ORDER — ONDANSETRON HCL 4 MG/2ML IJ SOLN: 4.0000 mg | Freq: Four times a day (QID) | INTRAMUSCULAR | Status: DC | PRN

## 2021-03-27 MED ORDER — PROPOFOL 10 MG/ML IV BOLUS
INTRAVENOUS | Status: DC | PRN
  Administered 2021-03-27: 180 mg via INTRAVENOUS

## 2021-03-27 MED ORDER — LIDOCAINE HCL URETHRAL/MUCOSAL 2 % EX GEL
CUTANEOUS | Status: AC
  Filled 2021-03-27: qty 30

## 2021-03-27 MED ORDER — BUPIVACAINE-EPINEPHRINE (PF) 0.25% -1:200000 IJ SOLN
INTRAMUSCULAR | Status: AC
  Filled 2021-03-27: qty 30

## 2021-03-27 MED ORDER — INSULIN GLARGINE-YFGN 100 UNIT/ML ~~LOC~~ SOLN
15.0000 [IU] | Freq: Every evening | SUBCUTANEOUS | Status: DC
  Administered 2021-03-28: 15 [IU] via SUBCUTANEOUS
  Filled 2021-03-27 (×3): qty 0.15

## 2021-03-27 MED ORDER — ONDANSETRON HCL 4 MG/2ML IJ SOLN
INTRAMUSCULAR | Status: AC
  Filled 2021-03-27: qty 2

## 2021-03-27 MED ORDER — MORPHINE SULFATE (PF) 4 MG/ML IV SOLN
4.0000 mg | Freq: Once | INTRAVENOUS | Status: AC
  Administered 2021-03-27: 04:00:00 4 mg via INTRAVENOUS
  Filled 2021-03-27: qty 1

## 2021-03-27 MED ORDER — PIPERACILLIN-TAZOBACTAM 3.375 G IVPB 30 MIN
3.3750 g | Freq: Once | INTRAVENOUS | Status: AC
  Administered 2021-03-27: 07:00:00 3.375 g via INTRAVENOUS
  Filled 2021-03-27: qty 50

## 2021-03-27 MED ORDER — OXYCODONE HCL 5 MG PO TABS
5.0000 mg | ORAL_TABLET | ORAL | Status: DC | PRN
  Administered 2021-03-28: 5 mg via ORAL

## 2021-03-27 MED ORDER — INSULIN ASPART 100 UNIT/ML IJ SOLN
15.0000 [IU] | Freq: Once | INTRAMUSCULAR | Status: AC
  Administered 2021-03-27: 09:00:00 15 [IU] via SUBCUTANEOUS
  Filled 2021-03-27: qty 0.15

## 2021-03-27 MED ORDER — IBUPROFEN 400 MG PO TABS: 400.0000 mg | ORAL_TABLET | Freq: Four times a day (QID) | ORAL | Status: DC | PRN

## 2021-03-27 MED ORDER — MIDAZOLAM HCL 5 MG/5ML IJ SOLN
INTRAMUSCULAR | Status: DC | PRN
  Administered 2021-03-27 (×2): 1 mg via INTRAVENOUS

## 2021-03-27 MED ORDER — SODIUM CHLORIDE 0.9 % IV BOLUS
1000.0000 mL | Freq: Once | INTRAVENOUS | Status: AC
  Administered 2021-03-27: 09:00:00 1000 mL via INTRAVENOUS

## 2021-03-27 MED ORDER — FENTANYL CITRATE (PF) 100 MCG/2ML IJ SOLN
INTRAMUSCULAR | Status: DC | PRN
  Administered 2021-03-27: 50 ug via INTRAVENOUS

## 2021-03-27 MED ORDER — MIDAZOLAM HCL 2 MG/2ML IJ SOLN
INTRAMUSCULAR | Status: AC
  Filled 2021-03-27: qty 2

## 2021-03-27 MED ORDER — ONDANSETRON HCL 4 MG PO TABS: 4.0000 mg | ORAL_TABLET | Freq: Four times a day (QID) | ORAL | Status: DC | PRN

## 2021-03-27 MED ORDER — BUPIVACAINE-EPINEPHRINE 0.25% -1:200000 IJ SOLN
INTRAMUSCULAR | Status: DC | PRN
  Administered 2021-03-27: 20 mL

## 2021-03-27 MED ORDER — POTASSIUM CHLORIDE CRYS ER 20 MEQ PO TBCR
40.0000 meq | EXTENDED_RELEASE_TABLET | Freq: Once | ORAL | Status: AC
  Administered 2021-03-27: 13:00:00 40 meq via ORAL
  Filled 2021-03-27: qty 2

## 2021-03-27 MED ORDER — LIDOCAINE HCL (PF) 2 % IJ SOLN
INTRAMUSCULAR | Status: AC
  Filled 2021-03-27: qty 5

## 2021-03-27 MED ORDER — LIDOCAINE 2% (20 MG/ML) 5 ML SYRINGE
INTRAMUSCULAR | Status: DC | PRN
  Administered 2021-03-27: 80 mg via INTRAVENOUS

## 2021-03-27 MED ORDER — LACTATED RINGERS IV BOLUS
1000.0000 mL | Freq: Once | INTRAVENOUS | Status: AC
  Administered 2021-03-27: 11:00:00 1000 mL via INTRAVENOUS

## 2021-03-27 MED ORDER — OXYCODONE HCL 5 MG/5ML PO SOLN: 5.0000 mg | Freq: Once | ORAL | Status: DC | PRN

## 2021-03-27 MED ORDER — OXYCODONE HCL 5 MG PO TABS: 5.0000 mg | ORAL_TABLET | Freq: Once | ORAL | Status: DC | PRN

## 2021-03-27 SURGICAL SUPPLY — 30 items
BAG COUNTER SPONGE SURGICOUNT (BAG) IMPLANT
BAG SPNG CNTER NS LX DISP (BAG)
BLADE SURG 15 STRL LF DISP TIS (BLADE) ×1 IMPLANT
BLADE SURG 15 STRL SS (BLADE) ×2
DRSG PAD ABDOMINAL 8X10 ST (GAUZE/BANDAGES/DRESSINGS) ×1 IMPLANT
ELECT REM PT RETURN 15FT ADLT (MISCELLANEOUS) ×2 IMPLANT
GAUZE 4X4 16PLY ~~LOC~~+RFID DBL (SPONGE) ×2 IMPLANT
GAUZE PACKING IODOFORM 1/2 (PACKING) ×1 IMPLANT
GAUZE PACKING IODOFORM 1X5 (PACKING) IMPLANT
GAUZE SPONGE 4X4 12PLY STRL (GAUZE/BANDAGES/DRESSINGS) IMPLANT
GLOVE SURG MICRO LTX SZ7.5 (GLOVE) ×2 IMPLANT
GLOVE SURG UNDER LTX SZ8 (GLOVE) ×4 IMPLANT
GLOVE SURG UNDER POLY LF SZ7 (GLOVE) ×2 IMPLANT
GOWN STRL REUS W/TWL LRG LVL3 (GOWN DISPOSABLE) ×2 IMPLANT
GOWN STRL REUS W/TWL XL LVL3 (GOWN DISPOSABLE) ×4 IMPLANT
HEMOSTAT SURGICEL 4X8 (HEMOSTASIS) IMPLANT
KIT BASIN OR (CUSTOM PROCEDURE TRAY) ×2 IMPLANT
KIT TURNOVER KIT A (KITS) IMPLANT
NEEDLE HYPO 22GX1.5 SAFETY (NEEDLE) ×2 IMPLANT
NS IRRIG 1000ML POUR BTL (IV SOLUTION) IMPLANT
PACK LITHOTOMY IV (CUSTOM PROCEDURE TRAY) ×2 IMPLANT
PENCIL SMOKE EVACUATOR (MISCELLANEOUS) IMPLANT
SPONGE SURGIFOAM ABS GEL 100 (HEMOSTASIS) IMPLANT
SPONGE T-LAP 18X18 ~~LOC~~+RFID (SPONGE) ×1 IMPLANT
SWAB COLLECTION DEVICE MRSA (MISCELLANEOUS) IMPLANT
SWAB CULTURE ESWAB REG 1ML (MISCELLANEOUS) IMPLANT
SYR BULB IRRIG 60ML STRL (SYRINGE) ×2 IMPLANT
SYR CONTROL 10ML LL (SYRINGE) ×2 IMPLANT
TOWEL OR 17X26 10 PK STRL BLUE (TOWEL DISPOSABLE) ×6 IMPLANT
YANKAUER SUCT BULB TIP NO VENT (SUCTIONS) ×2 IMPLANT

## 2021-03-27 NOTE — ED Notes (Signed)
Pt gone to scans

## 2021-03-27 NOTE — Progress Notes (Addendum)
Inpatient Diabetes Program Recommendations  AACE/ADA: New Consensus Statement on Inpatient Glycemic Control (2015)  Target Ranges:  Prepandial:   less than 140 mg/dL      Peak postprandial:   less than 180 mg/dL (1-2 hours)      Critically ill patients:  140 - 180 mg/dL   Lab Results  Component Value Date   GLUCAP 386 (H) 11/22/2020   HGBA1C 10.6 (H) 11/22/2020    Review of Glycemic Control  Latest Reference Range & Units 03/27/21 04:27  Glucose 70 - 99 mg/dL 030 (H)   Diabetes history: DM 2 (fairly new diagnosis) Outpatient Diabetes medications: Metformin 500 mg bid Current orders for Inpatient glycemic control:  Just transferred to [facility] ED for Surgery consult  A1c 10.6 on 11/22/20, based on level of A1c needed insulin at that time  Inpatient Diabetes Program Recommendations:    -  Start Semglee 20 units -  Novolog 0-15 units Q4 hours until possible procedure then tid + hs  Out of state medicaid [employer], may need insulin at d/c for wound healing  Spoke with pt and wife at bedside regarding recent diagnosis of Diabetes and management at home. Pt reports not being able to tolerate Metformin, but was still taking it. Pt also stated he was recently placed on Ozempic 0.25 mg QThursday for 1 month and then increase to 0.5 mg Weekly and Basaglar 15 units qhs last week at PCP office. Pt reports when he checked his glucose it was still in the 500-600 range. Pt did not check his glucose regularly. Encouraged checks at least bid (fasting and alternating second check). Discussed importance of glucose control perioperatively and on wound healing. Discussed glucose and A1c goals. Pt would benefit from a Freestyle Libre at time of d/c.   Discussed lifestyle modifications. Pt has been having many side effects of hyperglycemia. I discussed them with he and his wife and covered that many normalize when glucose trends decrease to goal. Will titrate insulin prior to going home based on insulin needs at this  time.   Thanks,  Christena Deem RN, MSN, BC-ADM Inpatient Diabetes Coordinator Team Pager [phone] (8a-5p)

## 2021-03-27 NOTE — ED Provider Notes (Signed)
[facility] EMERGENCY DEPT Provider Note   CSN: [id number] Arrival date & time: 03/27/21  4287     History Chief Complaint  Patient presents with   Cyst    Kerry Vaughn is a 40 y.o. male.  The history is provided by the patient.  He has history of diabetes and comes in because of a painful boil on his buttocks.  He noticed this 3 days ago.  He has treated it with hot soaks and has attempted to lance it on his own, but without any improvement.  Pain is getting worse and he currently rates pain a 10/10.  It is painful to sit or lay down.  He denies fever, chills, sweats.  He denies any nausea or vomiting.  He has never had anything like this before.   Past Medical History:  Diagnosis Date   Backache, unspecified    Diabetes mellitus without complication (HCC)    Disturbance of skin sensation    Insomnia, unspecified    Need for prophylactic vaccination with combined diphtheria-tetanus-pertussis (DTP) vaccine    Other musculoskeletal symptoms referable to limbs(729.89)     There are no problems to display for this patient.   History reviewed. No pertinent surgical history.     No family history on file.  Social History   Tobacco Use   Smoking status: Every Day    Types: Cigarettes    Passive exposure: Current   Smokeless tobacco: Never  Vaping Use   Vaping Use: Never used  Substance Use Topics   Alcohol use: Not Currently   Drug use: No    Home Medications Prior to Admission medications   Medication Sig Start Date End Date Taking? Authorizing Provider  acetaminophen-codeine 120-12 MG/5ML solution Take 10 mLs by mouth every 4 (four) hours as needed (cough). 04/05/13   Cherrie Distance, PA-C  azithromycin (ZITHROMAX) 250 MG tablet 1 tablet po daily 04/05/13   Cherrie Distance, PA-C  cyclobenzaprine (FLEXERIL) 10 MG tablet Take 10 mg by mouth 3 (three) times daily as needed for muscle spasms.    [provider]  metFORMIN (GLUCOPHAGE) 500 MG  tablet Take 1 tablet (500 mg total) by mouth 2 (two) times daily with a meal. 11/22/20   Arby Barrette, MD  pantoprazole (PROTONIX) 20 MG tablet Take 1 tablet (20 mg total) by mouth daily. 04/05/13   Cherrie Distance, PA-C    Allergies    Tylenol [acetaminophen]  Review of Systems   Review of Systems  All other systems reviewed and are negative.  Physical Exam Updated Vital Signs BP (!) 149/81 (BP Location: Right Arm)    Pulse 98    Temp 98.6 F (37 C) (Oral)    Resp 16    Ht 6\' 1"  (1.854 m)    Wt 112 kg    SpO2 96%    BMI 32.59 kg/m   Physical Exam Vitals and nursing note reviewed.  40 year old male, resting comfortably and in no acute distress. Vital signs are significant for elevated blood pressure. Oxygen saturation is 96%, which is normal. Head is normocephalic and atraumatic. PERRLA, EOMI. Oropharynx is clear. Neck is nontender and supple without adenopathy or JVD. Back is nontender and there is no CVA tenderness. Lungs are clear without rales, wheezes, or rhonchi. Chest is nontender. Heart has regular rate and rhythm without murmur. Abdomen is soft, flat, nontender without masses or hepatosplenomegaly and peristalsis is normoactive. Rectal: Large right perianal abscess measuring 9 cm x 5 cm with induration extending  all the way to the anal verge. Extremities have no cyanosis or edema, full range of motion is present. Skin is warm and dry without rash. Neurologic: Mental status is normal, cranial nerves are intact, moves all extremities equally.  ED Results / Procedures / Treatments   Labs (all labs ordered are listed, but only abnormal results are displayed) Labs Reviewed  BASIC METABOLIC PANEL - Abnormal; Notable for the following components:      Result Value   Sodium 131 (*)    Chloride 96 (*)    Glucose, Bld 464 (*)    All other components within normal limits  CBC WITH DIFFERENTIAL/PLATELET - Abnormal; Notable for the following components:   WBC 12.8 (*)     Neutro Abs 10.1 (*)    All other components within normal limits  RESP PANEL BY RT-PCR (FLU A&B, COVID) [id number]    Radiology CT PELVIS W CONTRAST  Result Date: 03/27/2021 CLINICAL DATA:  40 year old male with history of anorectal abscess. EXAM: CT PELVIS WITH CONTRAST TECHNIQUE: Multidetector CT imaging of the pelvis was performed using the standard protocol following the bolus administration of intravenous contrast. CONTRAST:  OMNIPAQUE IOHEXOL 300 MG/ML  SOLN COMPARISON:  CT the abdomen and pelvis 08/02/2006. FINDINGS: Urinary Tract: Distal ureters and urinary bladder are normal in appearance. Bowel: No pathologic dilatation of visualized portions of small bowel or colon. Appendix is normal. Vascular/Lymphatic: No pathologically enlarged lymph nodes. No significant vascular abnormality seen. Reproductive: Prostate gland and seminal vesicles are unremarkable in appearance. Other: There appears to be a trace amount of fluid adjacent to the right-side of the distal rectum immediately before the anus, extending from approximately 7:00 to 10:00, which extends caudally and traverses the levator ani musculature. In the medial aspect of the right ischioanal space (axial image 55 of series 2 and coronal image 29 of series 4) there is a 5.6 x 2.7 x 4.9 cm gas and fluid collection, compatible with an abscess. This may connect with the skin surface in the medial right gluteal cleft. Musculoskeletal: No aggressive appearing osseous lesions are noted in the visualized portions of the skeleton. Status post PLIF at L4-S1. IMPRESSION: 1. 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 Electronically Signed   By: Trudie Reed M.D.   On: 03/27/2021 06:11    Procedures Procedures   Medications Ordered in ED Medications  piperacillin-tazobactam (ZOSYN) IVPB 3.375 g (has no administration in time range)  morphine 4 MG/ML injection 4 mg (4 mg Intravenous Given 03/27/21 0424)  iohexol  (OMNIPAQUE) 300 MG/ML solution 100 mL (100 mLs Intravenous Contrast Given 03/27/21 0553)  morphine 4 MG/ML injection 4 mg (4 mg Intravenous Given 03/27/21 6160)    ED Course  I have reviewed the triage vital signs and the nursing notes.  Pertinent labs & imaging results that were available during my care of the patient were reviewed by me and considered in my medical decision making (see chart for details).   MDM Rules/Calculators/A&P                         Right perianal abscess.  I am concerned that this is extending into the perirectal area.  If so, will need more extensive drainage than I can do in the emergency department.  Will send for CT of pelvis.  Old records are reviewed, and he has no relevant past visits.  CT scan shows abscess and perianal fistula will which crosses the sphincter.  This clearly cannot be managed in the emergency department.  He is started on Zosyn, case is discussed with Dr. Derrell Lolling of general surgery who requests patient be transferred to [facility] emergency department where he will be evaluated.  Case is discussed with Dr. Read Drivers, ED physician at [facility] emergency department agrees accept the patient in transfer.  Final Clinical Impression(s) / ED Diagnoses Final diagnoses:  Perianal abscess  Hyponatremia    Rx / DC Orders ED Discharge Orders     None        Dione Booze, MD 03/27/21 [phone]

## 2021-03-27 NOTE — Progress Notes (Addendum)
[facility] admitting physician addendum.  The patient's potassium level initially was 3.5 mmol/L.  He has received over 2000 mL of IVF and 15 units of SQ insulin.  I will preemptively order 40 mEq of KCl to avoid hypokalemia.  Sanda Klein, MD.

## 2021-03-27 NOTE — ED Notes (Signed)
[facility] at bedside 

## 2021-03-27 NOTE — Op Note (Signed)
Preoperative diagnosis: Right ischial rectal abscess complex  Postop diagnosis: Same  Procedure: Incision and drainage right ischial rectal abscess complex  Surgeon: Erroll Luna, MD  Anesthesia: General with 0.25% Marcaine  EBL: 100 cc  Drains: None  Specimen: None  Indications for procedure: The patient is a 40 year old male with a large right ischial rectal abscess.  He is poorly controlled diabetic.  This is developed in the last 2 days.The procedure has been discussed with the patient.  Alternative therapies have been discussed with the patient.  Operative risks include bleeding,  Infection,  Organ injury,  Nerve injury,  Blood vessel injury,  DVT,  Pulmonary embolism,  Death,  And possible reoperation.  Medical management risks include worsening of present situation.  The success of the procedure is 50 -90 % at treating patients symptoms.  The patient understands and agrees to proceed.    Description of procedure: The patient was met in the holding area and questions answered.  He was then taken back to the operating room.  He is placed supine initially on the OR table.  After induction of general esthesia, he was placed in lithotomy with all pressure points padded appropriately.  There is no undue strain on his lower extremities in the service.  His perineum was prepped and draped in sterile fashion.  Timeout performed.  Proper patient, site and procedure verified.  On the right side was a large bulging area of swelling consistent with abscess.  Local anesthetic was infiltrated.  Small incision was made and copious amounts of pus and fluid were ejected from the wound.  We then enlarged the opening to about 2 cm.  Placed my finger into the large cavity which was mostly involving the gluteal region.  I washed this out.  Hemostasis achieved cautery was packed.  Anoscopy performed.  I did not see an obvious fistula but there was some abnormality in the posterior midline and the dentate line.   At this point time after irrigation and pressure holding hemostasis was excellent.  Dry dressings were applied.  He was taken lithotomy extubated taken recovery in satisfactory condition.  With no immediate complications.

## 2021-03-27 NOTE — Anesthesia Preprocedure Evaluation (Addendum)
Anesthesia Evaluation  Patient identified by MRN, date of birth, ID band Patient awake    Reviewed: Allergy & Precautions, NPO status , Patient's Chart, lab work & pertinent test results, reviewed documented beta blocker date and time   Airway Mallampati: II  TM Distance: >3 FB Neck ROM: Full    Dental no notable dental hx. (+) Teeth Intact, Dental Advisory Given   Pulmonary Current Smoker and Patient abstained from smoking.,    Pulmonary exam normal breath sounds clear to auscultation       Cardiovascular hypertension, Normal cardiovascular exam Rhythm:Regular Rate:Normal     Neuro/Psych Peripheral Neuropathy  Neuromuscular disease negative psych ROS   GI/Hepatic Neg liver ROS, Peri rectal abscess   Endo/Other  diabetes, Poorly Controlled, Type 2, Oral Hypoglycemic AgentsObesity  Hyperlipidemia  Renal/GU negative Renal ROS  negative genitourinary   Musculoskeletal negative musculoskeletal ROS (+)   Abdominal (+) + obese,   Peds  Hematology  (+) REFUSES BLOOD PRODUCTS,   Anesthesia Other Findings   Reproductive/Obstetrics                            Anesthesia Physical Anesthesia Plan  ASA: 3  Anesthesia Plan: General   Post-op Pain Management:    Induction: Intravenous  PONV Risk Score and Plan: 2 and Treatment may vary due to age or medical condition and Ondansetron  Airway Management Planned: LMA  Additional Equipment:   Intra-op Plan:   Post-operative Plan: Extubation in OR  Informed Consent: I have reviewed the patients History and Physical, chart, labs and discussed the procedure including the risks, benefits and alternatives for the proposed anesthesia with the patient or authorized representative who has indicated his/her understanding and acceptance.     Dental advisory given  Plan Discussed with: Anesthesiologist and CRNA  Anesthesia Plan Comments:          Anesthesia Quick Evaluation

## 2021-03-27 NOTE — Interval H&P Note (Signed)
History and Physical Interval Note:  03/27/2021 3:27 PM  Kerry Vaughn  has presented today for surgery, with the diagnosis of PERIANAL ABCESS.  The various methods of treatment have been discussed with the patient and family. After consideration of risks, benefits and other options for treatment, the patient has consented to  Procedure(s): IRRIGATION AND DEBRIDEMENT PERIRECTAL ABSCESS (N/A) as a surgical intervention.  The patient's history has been reviewed, patient examined, no change in status, stable for surgery.  I have reviewed the patient's chart and labs.  Questions were answered to the patient's satisfaction.    Pt seen, examined and agree Proceed for I/D of perirectal  abscess   Risk of bleeding infection recurrence ,injury to sphincter,  / adjacent organs    He agrees to proceed.  Dortha Schwalbe MD

## 2021-03-27 NOTE — ED Notes (Signed)
[facility] arrived with pt at this time. Reported pt recently dx with diabetes. Presented with right gluteal abscess, tried to lance it at home. Developed fistula that crosses anal sphincter. VSS. 4/10 pain.CBG 400. Arrived for surgical consult.

## 2021-03-27 NOTE — H&P (View-Only) (Signed)
Kerry Vaughn 1980/07/15  [id number].    Requesting MD: Dr. Milton Ferguson Chief Complaint/Reason for Consult: Perianal abscess  HPI: Kerry Vaughn is a 40 y.o. male with a hx of uncontrolled IDDM2 and tobacco abuse (1PPD) who presented with buttock pain. Patient reports 4 days ago he began having pain and swelling of his right buttock. He tried epsom salt baths and warm compresses to the area without relief. The area continued to become more swollen and tender so he tried poking it with a needle (he says he ran the needle under an open flame before doing this) without any drainage or relief. No fever, chills, n/v, abdominal pain or drainage. He denies hx of similar symptoms in the past. He denies prior perirectal/perianal abscesses. No personal or FHx of IBD. He has never had a colonoscopy. He reports his glucose monitor at home as been reading high for greater then 1 week. He had his A1c checked at [facility] (PCP: Fredrich Romans) which was > 15. CT A/P with R perianal abscess with possible fistula. WBC 12.8. Glucose 464. We were asked to see.   ROS: Review of Systems  Constitutional:  Negative for chills and fever.  Gastrointestinal:  Negative for abdominal pain, nausea and vomiting.  Genitourinary:        R perirectal pain  Psychiatric/Behavioral:  Negative for substance abuse.   All other systems reviewed and are negative.  No family history on file.  Past Medical History:  Diagnosis Date   Backache, unspecified    Diabetes mellitus without complication (HCC)    Disturbance of skin sensation    Insomnia, unspecified    Need for prophylactic vaccination with combined diphtheria-tetanus-pertussis (DTP) vaccine    Other musculoskeletal symptoms referable to limbs(729.89)     History reviewed. No pertinent surgical history.  Social History:  reports that he has been smoking cigarettes. He has been exposed to tobacco smoke. He has never used smokeless tobacco. He reports  that he does not currently use alcohol. He reports that he does not use drugs. Patient reports he is on disability He smokes 1 PPD No drug use No alcohol use Married  Allergies:  Allergies  Allergen Reactions   Tylenol [Acetaminophen]     (Not in a hospital admission)    Physical Exam: Blood pressure (!) 106/55, pulse 86, temperature 98.6 F (37 C), temperature source Oral, resp. rate 18, height 6\' 1"  (1.854 m), weight 112 kg, SpO2 97 %. General: pleasant, WD/WN white AA male who is laying in bed in NAD HEENT: head is normocephalic, atraumatic.  Sclera are noninjected.  PERRL.  Ears and nose without any masses or lesions.  Mouth is pink and moist. Dentition fair Heart: regular, rate, and rhythm.  Normal s1,s2. No obvious murmurs, gallops, or rubs noted.  Palpable pedal pulses bilaterally  Lungs: CTAB, no wheezes, rhonchi, or rales noted.  Respiratory effort nonlabored Abd: Soft, NT/ND, +BS, no masses, hernias, or organomegaly MS: no BUE/BLE edema, calves soft and nontender GU: Patient with 6x5cm area of blanchable erythema, induration and edema of the right buttock extending a few cm away from the anus. No drainage. No palpable erythema, heat, induration or fluctuance on the L.  Skin: As noted above. Otherwise warm and dry with no masses, lesions, or rashes Psych: A&Ox4 with an appropriate affect Neuro: cranial nerves grossly intact, equal strength in BUE/BLE bilaterally, normal speech, thought process intact, moves all extremities, gait not assessed   Results for orders placed or  performed during the hospital encounter of 03/27/21 (from the past 48 hour(s))  °Basic metabolic panel     Status: Abnormal  ° Collection Time: 03/27/21  4:27 AM  °Result Value Ref Range  ° Sodium 131 (L) 135 - 145 mmol/L  ° Potassium 3.5 3.5 - 5.1 mmol/L  ° Chloride 96 (L) 98 - 111 mmol/L  ° CO2 24 22 - 32 mmol/L  ° Glucose, Bld 464 (H) 70 - 99 mg/dL  °  Comment: Glucose reference range applies only to  samples taken after fasting for at least 8 hours.  ° BUN 9 6 - 20 mg/dL  ° Creatinine, Ser 0.86 0.61 - 1.24 mg/dL  ° Calcium 9.4 8.9 - 10.3 mg/dL  ° GFR, Estimated >60 >60 mL/min  °  Comment: (NOTE) °Calculated using the CKD-EPI Creatinine Equation (2021) °  ° Anion gap 11 5 - 15  °  Comment: Performed at [facility], [address], [place], [place] [address]  °CBC with Differential     Status: Abnormal  ° Collection Time: 03/27/21  4:27 AM  °Result Value Ref Range  ° WBC 12.8 (H) 4.0 - 10.5 K/uL  ° RBC 5.03 4.22 - 5.81 MIL/uL  ° Hemoglobin 14.6 13.0 - 17.0 g/dL  ° HCT 42.4 39.0 - 52.0 %  ° MCV 84.3 80.0 - 100.0 fL  ° MCH 29.0 26.0 - 34.0 pg  ° MCHC 34.4 30.0 - 36.0 g/dL  ° RDW 12.9 11.5 - 15.5 %  ° Platelets 250 150 - 400 K/uL  ° nRBC 0.0 0.0 - 0.2 %  ° Neutrophils Relative % 79 %  ° Neutro Abs 10.1 (H) 1.7 - 7.7 K/uL  ° Lymphocytes Relative 13 %  ° Lymphs Abs 1.6 0.7 - 4.0 K/uL  ° Monocytes Relative 8 %  ° Monocytes Absolute 1.0 0.1 - 1.0 K/uL  ° Eosinophils Relative 0 %  ° Eosinophils Absolute 0.0 0.0 - 0.5 K/uL  ° Basophils Relative 0 %  ° Basophils Absolute 0.0 0.0 - 0.1 K/uL  ° Immature Granulocytes 0 %  ° Abs Immature Granulocytes 0.04 0.00 - 0.07 K/uL  °  Comment: Performed at [facility], [address], [place], [place] [address]  °Resp Panel by RT-PCR (Flu A&B, Covid) Nasopharyngeal Swab     Status: None  ° Collection Time: 03/27/21  6:16 AM  ° Specimen: Nasopharyngeal Swab; Nasopharyngeal(NP) swabs in vial transport medium  °Result Value Ref Range  ° SARS Coronavirus 2 by RT PCR NEGATIVE NEGATIVE  °  Comment: (NOTE) °SARS-CoV-2 target nucleic acids are NOT DETECTED. ° °The SARS-CoV-2 RNA is generally detectable in upper respiratory °specimens during the acute phase of infection. The lowest °concentration of SARS-CoV-2 viral copies this assay can detect is °138 copies/mL. A negative result does not preclude SARS-Cov-2 °infection and should not be used as the sole  basis for treatment or °other patient management decisions. A negative result may occur with  °improper specimen collection/handling, submission of specimen other °than nasopharyngeal swab, presence of viral mutation(s) within the °areas targeted by this assay, and inadequate number of viral °copies(<138 copies/mL). A negative result must be combined with °clinical observations, patient history, and epidemiological °information. The expected result is Negative. ° °Fact Sheet for Patients:  °https://www.fda.gov/media/152166/download ° °Fact Sheet for Healthcare Providers:  °https://www.fda.gov/media/152162/download ° °This test is no t yet approved or cleared by the [place] FDA and  °has been authorized for detection and/or diagnosis of SARS-CoV-2 by °FDA under an Emergency Use   Authorization (EUA). This EUA will remain  in effect (meaning this test can be used) for the duration of the COVID-19 declaration under Section 564(b)(1) of the Act, 21 U.S.C.section 360bbb-3(b)(1), unless the authorization is terminated  or revoked sooner.       Influenza A by PCR NEGATIVE NEGATIVE   Influenza B by PCR NEGATIVE NEGATIVE    Comment: (NOTE) The Xpert Xpress SARS-CoV-2/FLU/RSV plus assay is intended as an aid in the diagnosis of influenza from Nasopharyngeal swab specimens and should not be used as a sole basis for treatment. Nasal washings and aspirates are unacceptable for Xpert Xpress SARS-CoV-2/FLU/RSV testing.  Fact Sheet for Patients: EntrepreneurPulse.com.au  Fact Sheet for Healthcare Providers: IncredibleEmployment.be  This test is not yet approved or cleared by the [place] FDA and has been authorized for detection and/or diagnosis of SARS-CoV-2 by FDA under an Emergency Use Authorization (EUA). This EUA will remain in effect (meaning this test can be used) for the duration of the COVID-19 declaration under Section 564(b)(1) of the Act, 21  U.S.C. section 360bbb-3(b)(1), unless the authorization is terminated or revoked.  Performed at [facility], [address], [place], [place] [address]    CT PELVIS W CONTRAST  Result Date: 03/27/2021 CLINICAL DATA:  40 year old male with history of anorectal abscess. EXAM: CT PELVIS WITH CONTRAST TECHNIQUE: Multidetector CT imaging of the pelvis was performed using the standard protocol following the bolus administration of intravenous contrast. CONTRAST:  168mL OMNIPAQUE IOHEXOL 300 MG/ML  SOLN COMPARISON:  CT the abdomen and pelvis 08/02/2006. FINDINGS: Urinary Tract: Distal ureters and urinary bladder are normal in appearance. Bowel: No pathologic dilatation of visualized portions of small bowel or colon. Appendix is normal. Vascular/Lymphatic: No pathologically enlarged lymph nodes. No significant vascular abnormality seen. Reproductive: Prostate gland and seminal vesicles are unremarkable in appearance. Other: There appears to be a trace amount of fluid adjacent to the right-side of the distal rectum immediately before the anus, extending from approximately 7:00 to 10:00, which extends caudally and traverses the levator ani musculature. In the medial aspect of the right ischioanal space (axial image 55 of series 2 and coronal image 29 of series 4) there is a 5.6 x 2.7 x 4.9 cm gas and fluid collection, compatible with an abscess. This may connect with the skin surface in the medial right gluteal cleft. Musculoskeletal: No aggressive appearing osseous lesions are noted in the visualized portions of the skeleton. Status post PLIF at L4-S1. IMPRESSION: 1. 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 Electronically Signed   By: Vinnie Langton M.D.   On: 03/27/2021 06:11    Anti-infectives (From admission, onward)    Start     Dose/Rate Route Frequency Ordered Stop   03/27/21 1600  piperacillin-tazobactam (ZOSYN) IVPB 3.375 g        3.375 g 12.5 mL/hr  over 240 Minutes Intravenous Every 8 hours 03/27/21 1009     03/27/21 1400  piperacillin-tazobactam (ZOSYN) IVPB 3.375 g  Status:  Discontinued        3.375 g 100 mL/hr over 30 Minutes Intravenous Every 8 hours 03/27/21 1004 03/27/21 1008   03/27/21 0700  piperacillin-tazobactam (ZOSYN) IVPB 3.375 g        3.375 g 100 mL/hr over 30 Minutes Intravenous  Once 03/27/21 0659 03/27/21 0745       Assessment/Plan R Perianal abscess - Pending OR availability and ability to correct hyperglycemia, will plan for OR today for EUA/I&D of perianal abscess. Discussed with patient possibility  this may be delayed until tomorrow. Keep NPO for now - Agree with IV abx. Cont - Agree with medical admission for glucose control  FEN - NPO, IVF per TRH VTE - SCDs, okay for chemical prophylaxis from a general surgery standpoint ID - Zosyn 12/14 >> Afebrile. WBC 12.8  Tobacco abuse - 1PPD. Encourage cessation Uncontrolled IDDM - A1c per patient > 15 last week. Glucose on admission 464  Jacinto Halim, [facility] 03/27/2021, 10:24 AM Please see Amion for pager number during day hours 7:00am-4:30pm

## 2021-03-27 NOTE — ED Notes (Signed)
Handoff report given to [facility] and Glass blower/designer at [facility] ED

## 2021-03-27 NOTE — Anesthesia Postprocedure Evaluation (Signed)
Anesthesia Post Note  Patient: Kerry Vaughn  Procedure(s) Performed: IRRIGATION AND DEBRIDEMENT PERIRECTAL ABSCESS     Patient location during evaluation: PACU Anesthesia Type: General Level of consciousness: awake and alert and oriented Pain management: pain level controlled Vital Signs Assessment: post-procedure vital signs reviewed and stable Respiratory status: spontaneous breathing, nonlabored ventilation and respiratory function stable Cardiovascular status: blood pressure returned to baseline and stable Postop Assessment: no apparent nausea or vomiting Anesthetic complications: no   No notable events documented.  Last Vitals:  Vitals:   03/27/21 1630 03/27/21 1645  BP: (!) 143/85 135/83  Pulse: 90 82  Resp: 17 17  Temp: 37.1 C   SpO2: 100% 94%    Last Pain:  Vitals:   03/27/21 1630  TempSrc:   PainSc: 0-No pain                 Teofil Maniaci A.

## 2021-03-27 NOTE — ED Triage Notes (Signed)
Possible cyst and reddened area to right gluteal area.

## 2021-03-27 NOTE — H&P (Signed)
History and Physical    Kerry Vaughn [id number] DOB: [birth year] DOA: 03/27/2021  PCP: [facility] [place]   Patient coming from: Home.   I have personally briefly reviewed patient's old medical records in [place]  Chief Complaint: "Buttock infection".  HPI: Kerry Vaughn is a 40 y.o. male with medical history significant of class I obesity, chronic back pain, hypertension, hyperlipidemia, insomnia, polyneuropathy, type 2 diabetes who is coming to the emergency department due to progressively worse right buttock infection for the past 4 days associated with pain in the area and hyperglycemia.  He tried epsom salt baths and warm compresses to the area without relief.  He also tried draining it by inserting a needle in it.  The patient stated he disinfected it with an open plan for.  He also took some amoxicillin.  He attempted 3 4 with significant relief.  He CBG has been reading high at home.  His most recent hemoglobin A1c with his PCP was over 15% last week.  He has been having decreased appetite, polyuria and polydipsia at home in the past few days.Marland Kitchen  He denied fever, chills, night sweats, nausea, emesis, headache, rhinorrhea, sore throat, productive cough, dyspnea or hemoptysis.  No chest pain, palpitations, diaphoresis, PND, orthopnea or pitting edema of the lower extremities.  No abdominal pain, diarrhea, constipation, melena or hematochezia.  No flank pain, dysuria, frequency or hematuria.  ED Course: Initial vital signs were temperature 98.6 F, pulse 98, respirations 16, BP 149/81 mmHg and O2 sat 96% on room air.  The patient received NovoLog 15 units SQ x1, hydromorphone 0.5 mg IVP x1, NS 1000 mL IVP x1, morphine 4 mg IVP x1, ondansetron 4 mg IVP x1 and Zosyn 3.375 g IVPB.  Lab work: CBC showed a white count of 12.9 with 79% neutrophils, hemoglobin 14.5 and platelets 250.  BMP showed a glucose of 464 mg/dL.  The rest of the BMP results are normal when sodium/chloride corrected  to glucose.  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  Please see images and full radiology report for further details.  Review of Systems: As per HPI otherwise all other systems reviewed and are negative.  Past Medical History:  Diagnosis Date   Backache, unspecified    Disturbance of skin sensation    Essential (primary) hypertension 07/30/2020   Insomnia, unspecified    Need for prophylactic vaccination with combined diphtheria-tetanus-pertussis (DTP) vaccine    Other musculoskeletal symptoms referable to limbs(729.89)    Polyneuropathy 11/19/2020   Type 2 diabetes mellitus ([place]) 03/27/2021   History reviewed. No pertinent surgical history.  Social History  reports that he has been smoking cigarettes. He has been exposed to tobacco smoke. He has never used smokeless tobacco. He reports that he does not currently use alcohol. He reports that he does not use drugs.  Allergies  Allergen Reactions   Tylenol [Acetaminophen]    Family History  Problem Relation Age of Onset   Hypertension Other    Diabetes Other    Prior to Admission medications   Medication Sig Start Date End Date Taking? Authorizing Provider  amoxicillin (AMOXIL) 500 MG capsule Take 500 mg by mouth 3 (three) times daily.   Yes [provider]  Insulin Glargine (BASAGLAR KWIKPEN) 100 UNIT/ML Inject 15 Units into the skin every evening.   Yes [provider]  metFORMIN (GLUCOPHAGE) 500 MG tablet Take 1 tablet (500 mg total) by mouth 2 (two) times daily with  a meal. 11/22/20   Arby Barrette, MD  metFORMIN (GLUCOPHAGE-XR) 500 MG 24 hr tablet Take 1,000 mg by mouth 2 (two) times daily. 03/13/21   [provider]  oxyCODONE (ROXICODONE) 15 MG immediate release tablet Take 15 mg by mouth every 6 (six) hours as needed for pain. 02/28/21   [provider]  TRULICITY 0.75 MG/0.5ML SOPN Inject 0.75 mg into the skin once a week. 03/19/21    [provider]   Physical Exam: Vitals:   03/27/21 0612 03/27/21 0615 03/27/21 0700 03/27/21 1040  BP: (!) 112/52 128/88 (!) 106/55 116/73  Pulse: 88 98 86 91  Resp: 18 18 18 18   Temp:      TempSrc:      SpO2: 95% 97% 97% 93%  Weight:      Height:       Constitutional: NAD, calm, comfortable Eyes: PERRL, lids and conjunctivae normal ENMT: Mucous membranes are moist. Posterior pharynx clear of any exudate or lesions. Neck: normal, supple, no masses, no thyromegaly Respiratory: clear to auscultation bilaterally, no wheezing, no crackles. Normal respiratory effort. No accessory muscle use.  Cardiovascular: Regular rate and rhythm, no murmurs / rubs / gallops. No extremity edema. 2+ pedal pulses. No carotid bruits.  Abdomen: Obese: No distention.  Bowel sounds positive.  Soft, no tenderness, no masses palpated. No hepatosplenomegaly.  Musculoskeletal: no clubbing / cyanosis. Good ROM, no contractures. Normal muscle tone.  Skin: Right gluteal/perianal area edema, erythema, calor and TTP. Neurologic: CN 2-12 grossly intact. Sensation intact, DTR normal. Strength 5/5 in all 4.  Psychiatric: Normal judgment and insight. Alert and oriented x 3. Normal mood.   Labs on Admission: I have personally reviewed following labs and imaging studies  CBC: Recent Labs  Lab 03/27/21 0427  WBC 12.8*  NEUTROABS 10.1*  HGB 14.6  HCT 42.4  MCV 84.3  PLT 250   Basic Metabolic Panel: Recent Labs  Lab 03/27/21 0427  NA 131*  K 3.5  CL 96*  CO2 24  GLUCOSE 464*  BUN 9  CREATININE 0.86  CALCIUM 9.4   GFR: Estimated Creatinine Clearance: 148.2 mL/min (by C-G formula based on SCr of 0.86 mg/dL).  Liver Function Tests: No results for input(s): AST, ALT, ALKPHOS, BILITOT, PROT, ALBUMIN in the last 168 hours.  Radiological Exams on Admission: CT PELVIS W CONTRAST  Result Date: 03/27/2021 CLINICAL DATA:  40 year old male with history of anorectal abscess. EXAM: CT PELVIS WITH  CONTRAST TECHNIQUE: Multidetector CT imaging of the pelvis was performed using the standard protocol following the bolus administration of intravenous contrast. CONTRAST:  46 OMNIPAQUE IOHEXOL 300 MG/ML  SOLN COMPARISON:  CT the abdomen and pelvis 08/02/2006. FINDINGS: Urinary Tract: Distal ureters and urinary bladder are normal in appearance. Bowel: No pathologic dilatation of visualized portions of small bowel or colon. Appendix is normal. Vascular/Lymphatic: No pathologically enlarged lymph nodes. No significant vascular abnormality seen. Reproductive: Prostate gland and seminal vesicles are unremarkable in appearance. Other: There appears to be a trace amount of fluid adjacent to the right-side of the distal rectum immediately before the anus, extending from approximately 7:00 to 10:00, which extends caudally and traverses the levator ani musculature. In the medial aspect of the right ischioanal space (axial image 55 of series 2 and coronal image 29 of series 4) there is a 5.6 x 2.7 x 4.9 cm gas and fluid collection, compatible with an abscess. This may connect with the skin surface in the medial right gluteal cleft. Musculoskeletal: No aggressive appearing osseous  lesions are noted in the visualized portions of the skeleton. Status post PLIF at L4-S1. IMPRESSION: 1. 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 Electronically Signed   By: Vinnie Langton M.D.   On: 03/27/2021 06:11    EKG: Independently reviewed.   Assessment/Plan Principal Problem:   Perianal fistula Observation/MedSurg. Keep n.p.o. per surgery. Continue IV fluids. Analgesics as needed. Antiemetics as needed.  Zosyn 3.375 g IV every 8 hours.  Active Problems:   Type 2 diabetes mellitus with hyperglycemia (HCC) Hemoglobin A1c was more than 15 last week. Currently NPO per surgery instructions. Continue IV fluids. CBG monitoring every 6 hours. Continue Semglee 15 units SQ every evening. This may need  to be adjusted up once cleared for oral intake. CBG monitoring every 6 hours with RI SS. Briefly advised about DM risk of CVD, ESRD, blindness and amputation.    Essential (primary) hypertension Currently not on medical therapy. Begin ACE inhibitor if BP rises.    Polyneuropathy Blood glucose control. Analgesics as needed. Follow-up with PCP.    Class 1 obesity Lifestyle modifications. Follow-up with PCP.     DVT prophylaxis: Lovenox SQ. Code Status:   Full code. Family Communication:   Disposition Plan:   Patient is from:  Home.  Anticipated DC to:  Home.  Anticipated DC date:  03/29/2021.  Anticipated DC barriers: Clinical status.  Consults called:  Erroll Luna, MD (general surgery). Admission status:  Observation/telemetry.   Severity of Illness: High severity after presenting with right gluteal area pain for the past 4 days in the setting of perianal abscess and fistula.  The patient will be observed for IV antibiotic therapy and surgical evaluation.   Reubin Milan MD [facility]  How to contact the [facility], [facility], [facility] Attending or Consulting provider [place] or covering provider during after hours [place], for this patient?   Check the care team in [facility] and look for a) attending/consulting [facility] provider listed and b) the [facility] team listed Log into [email or web address] and use [place] universal password to access. If you do not have the password, please contact the hospital operator. Locate the [facility] provider you are looking for under [facility] and page to a number that you can be directly reached. If you still have difficulty reaching the provider, please page the [facility] (Director on Call) for the Hospitalists listed on amion for assistance.  03/27/2021, 11:24 AM   This document was prepared using Dragon voice recognition software and may contain some unintended transcription errors.

## 2021-03-27 NOTE — Consult Note (Addendum)
Kerry Vaughn March 21, 1981  [id number].    Requesting MD: Dr. Milton Ferguson Chief Complaint/Reason for Consult: Perianal abscess  HPI: Kerry Vaughn is a 40 y.o. male with a hx of uncontrolled IDDM2 and tobacco abuse (1PPD) who presented with buttock pain. Patient reports 4 days ago he began having pain and swelling of his right buttock. He tried epsom salt baths and warm compresses to the area without relief. The area continued to become more swollen and tender so he tried poking it with a needle (he says he ran the needle under an open flame before doing this) without any drainage or relief. No fever, chills, n/v, abdominal pain or drainage. He denies hx of similar symptoms in the past. He denies prior perirectal/perianal abscesses. No personal or FHx of IBD. He has never had a colonoscopy. He reports his glucose monitor at home as been reading high for greater then 1 week. He had his A1c checked at [facility] (PCP: Fredrich Romans) which was > 15. CT A/P with R perianal abscess with possible fistula. WBC 12.8. Glucose 464. We were asked to see.   ROS: Review of Systems  Constitutional:  Negative for chills and fever.  Gastrointestinal:  Negative for abdominal pain, nausea and vomiting.  Genitourinary:        R perirectal pain  Psychiatric/Behavioral:  Negative for substance abuse.   All other systems reviewed and are negative.  No family history on file.  Past Medical History:  Diagnosis Date   Backache, unspecified    Diabetes mellitus without complication (HCC)    Disturbance of skin sensation    Insomnia, unspecified    Need for prophylactic vaccination with combined diphtheria-tetanus-pertussis (DTP) vaccine    Other musculoskeletal symptoms referable to limbs(729.89)     History reviewed. No pertinent surgical history.  Social History:  reports that he has been smoking cigarettes. He has been exposed to tobacco smoke. He has never used smokeless tobacco. He reports  that he does not currently use alcohol. He reports that he does not use drugs. Patient reports he is on disability He smokes 1 PPD No drug use No alcohol use Married  Allergies:  Allergies  Allergen Reactions   Tylenol [Acetaminophen]     (Not in a hospital admission)    Physical Exam: Blood pressure (!) 106/55, pulse 86, temperature 98.6 F (37 C), temperature source Oral, resp. rate 18, height 6\' 1"  (1.854 m), weight 112 kg, SpO2 97 %. General: pleasant, WD/WN white AA male who is laying in bed in NAD HEENT: head is normocephalic, atraumatic.  Sclera are noninjected.  PERRL.  Ears and nose without any masses or lesions.  Mouth is pink and moist. Dentition fair Heart: regular, rate, and rhythm.  Normal s1,s2. No obvious murmurs, gallops, or rubs noted.  Palpable pedal pulses bilaterally  Lungs: CTAB, no wheezes, rhonchi, or rales noted.  Respiratory effort nonlabored Abd: Soft, NT/ND, +BS, no masses, hernias, or organomegaly MS: no BUE/BLE edema, calves soft and nontender GU: Patient with 6x5cm area of blanchable erythema, induration and edema of the right buttock extending a few cm away from the anus. No drainage. No palpable erythema, heat, induration or fluctuance on the L.  Skin: As noted above. Otherwise warm and dry with no masses, lesions, or rashes Psych: A&Ox4 with an appropriate affect Neuro: cranial nerves grossly intact, equal strength in BUE/BLE bilaterally, normal speech, thought process intact, moves all extremities, gait not assessed   Results for orders placed or  performed during the hospital encounter of 03/27/21 (from the past 48 hour(s))  Basic metabolic panel     Status: Abnormal   Collection Time: 03/27/21  4:27 AM  Result Value Ref Range   Sodium 131 (L) 135 - 145 mmol/L   Potassium 3.5 3.5 - 5.1 mmol/L   Chloride 96 (L) 98 - 111 mmol/L   CO2 24 22 - 32 mmol/L   Glucose, Bld 464 (H) 70 - 99 mg/dL    Comment: Glucose reference range applies only to  samples taken after fasting for at least 8 hours.   BUN 9 6 - 20 mg/dL   Creatinine, Ser 0.86 0.61 - 1.24 mg/dL   Calcium 9.4 8.9 - 10.3 mg/dL   GFR, Estimated >60 >60 mL/min    Comment: (NOTE) Calculated using the CKD-EPI Creatinine Equation (2021)    Anion gap 11 5 - 15    Comment: Performed at [facility], [address] [place], [place] [address]  CBC with Differential     Status: Abnormal   Collection Time: 03/27/21  4:27 AM  Result Value Ref Range   WBC 12.8 (H) 4.0 - 10.5 K/uL   RBC 5.03 4.22 - 5.81 MIL/uL   Hemoglobin 14.6 13.0 - 17.0 g/dL   HCT 42.4 39.0 - 52.0 %   MCV 84.3 80.0 - 100.0 fL   MCH 29.0 26.0 - 34.0 pg   MCHC 34.4 30.0 - 36.0 g/dL   RDW 12.9 11.5 - 15.5 %   Platelets 250 150 - 400 K/uL   nRBC 0.0 0.0 - 0.2 %   Neutrophils Relative % 79 %   Neutro Abs 10.1 (H) 1.7 - 7.7 K/uL   Lymphocytes Relative 13 %   Lymphs Abs 1.6 0.7 - 4.0 K/uL   Monocytes Relative 8 %   Monocytes Absolute 1.0 0.1 - 1.0 K/uL   Eosinophils Relative 0 %   Eosinophils Absolute 0.0 0.0 - 0.5 K/uL   Basophils Relative 0 %   Basophils Absolute 0.0 0.0 - 0.1 K/uL   Immature Granulocytes 0 %   Abs Immature Granulocytes 0.04 0.00 - 0.07 K/uL    Comment: Performed at [facility], [address] [place], [place] [address]  Resp Panel by RT-PCR (Flu A&B, Covid) Nasopharyngeal Swab     Status: None   Collection Time: 03/27/21  6:16 AM   Specimen: Nasopharyngeal Swab; Nasopharyngeal(NP) swabs in vial transport medium  Result Value Ref Range   SARS Coronavirus 2 by RT PCR NEGATIVE NEGATIVE    Comment: (NOTE) SARS-CoV-2 target nucleic acids are NOT DETECTED.  The SARS-CoV-2 RNA is generally detectable in upper respiratory specimens during the acute phase of infection. The lowest concentration of SARS-CoV-2 viral copies this assay can detect is 138 copies/mL. A negative result does not preclude SARS-Cov-2 infection and should not be used as the sole  basis for treatment or other patient management decisions. A negative result may occur with  improper specimen collection/handling, submission of specimen other than nasopharyngeal swab, presence of viral mutation(s) within the areas targeted by this assay, and inadequate number of viral copies(<138 copies/mL). A negative result must be combined with clinical observations, patient history, and epidemiological information. The expected result is Negative.  Fact Sheet for Patients:  EntrepreneurPulse.com.au  Fact Sheet for Healthcare Providers:  IncredibleEmployment.be  This test is no t yet approved or cleared by the [place] FDA and  has been authorized for detection and/or diagnosis of SARS-CoV-2 by FDA under an Emergency Use  Authorization (EUA). This EUA will remain  in effect (meaning this test can be used) for the duration of the COVID-19 declaration under Section 564(b)(1) of the Act, 21 U.S.C.section 360bbb-3(b)(1), unless the authorization is terminated  or revoked sooner.       Influenza A by PCR NEGATIVE NEGATIVE   Influenza B by PCR NEGATIVE NEGATIVE    Comment: (NOTE) The Xpert Xpress SARS-CoV-2/FLU/RSV plus assay is intended as an aid in the diagnosis of influenza from Nasopharyngeal swab specimens and should not be used as a sole basis for treatment. Nasal washings and aspirates are unacceptable for Xpert Xpress SARS-CoV-2/FLU/RSV testing.  Fact Sheet for Patients: EntrepreneurPulse.com.au  Fact Sheet for Healthcare Providers: IncredibleEmployment.be  This test is not yet approved or cleared by the [place] FDA and has been authorized for detection and/or diagnosis of SARS-CoV-2 by FDA under an Emergency Use Authorization (EUA). This EUA will remain in effect (meaning this test can be used) for the duration of the COVID-19 declaration under Section 564(b)(1) of the Act, 21  U.S.C. section 360bbb-3(b)(1), unless the authorization is terminated or revoked.  Performed at [facility], [address] [place], [place] [address]    CT PELVIS W CONTRAST  Result Date: 03/27/2021 CLINICAL DATA:  40 year old male with history of anorectal abscess. EXAM: CT PELVIS WITH CONTRAST TECHNIQUE: Multidetector CT imaging of the pelvis was performed using the standard protocol following the bolus administration of intravenous contrast. CONTRAST:  16mL OMNIPAQUE IOHEXOL 300 MG/ML  SOLN COMPARISON:  CT the abdomen and pelvis 08/02/2006. FINDINGS: Urinary Tract: Distal ureters and urinary bladder are normal in appearance. Bowel: No pathologic dilatation of visualized portions of small bowel or colon. Appendix is normal. Vascular/Lymphatic: No pathologically enlarged lymph nodes. No significant vascular abnormality seen. Reproductive: Prostate gland and seminal vesicles are unremarkable in appearance. Other: There appears to be a trace amount of fluid adjacent to the right-side of the distal rectum immediately before the anus, extending from approximately 7:00 to 10:00, which extends caudally and traverses the levator ani musculature. In the medial aspect of the right ischioanal space (axial image 55 of series 2 and coronal image 29 of series 4) there is a 5.6 x 2.7 x 4.9 cm gas and fluid collection, compatible with an abscess. This may connect with the skin surface in the medial right gluteal cleft. Musculoskeletal: No aggressive appearing osseous lesions are noted in the visualized portions of the skeleton. Status post PLIF at L4-S1. IMPRESSION: 1. 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 Electronically Signed   By: Vinnie Langton M.D.   On: 03/27/2021 06:11    Anti-infectives (From admission, onward)    Start     Dose/Rate Route Frequency Ordered Stop   03/27/21 1600  piperacillin-tazobactam (ZOSYN) IVPB 3.375 g        3.375 g 12.5 mL/hr  over 240 Minutes Intravenous Every 8 hours 03/27/21 1009     03/27/21 1400  piperacillin-tazobactam (ZOSYN) IVPB 3.375 g  Status:  Discontinued        3.375 g 100 mL/hr over 30 Minutes Intravenous Every 8 hours 03/27/21 1004 03/27/21 1008   03/27/21 0700  piperacillin-tazobactam (ZOSYN) IVPB 3.375 g        3.375 g 100 mL/hr over 30 Minutes Intravenous  Once 03/27/21 0659 03/27/21 0745       Assessment/Plan R Perianal abscess - Pending OR availability and ability to correct hyperglycemia, will plan for OR today for EUA/I&D of perianal abscess. Discussed with patient possibility  this may be delayed until tomorrow. Keep NPO for now - Agree with IV abx. Cont - Agree with medical admission for glucose control  FEN - NPO, IVF per TRH VTE - SCDs, okay for chemical prophylaxis from a general surgery standpoint ID - Zosyn 12/14 >> Afebrile. WBC 12.8  Tobacco abuse - 1PPD. Encourage cessation Uncontrolled IDDM - A1c per patient > 15 last week. Glucose on admission 464  Jacinto Halim, [place] [facility] 03/27/2021, 10:24 AM Please see Amion for pager number during day hours 7:00am-4:30pm

## 2021-03-27 NOTE — Transfer of Care (Signed)
Immediate Anesthesia Transfer of Care Note  Patient: Kerry Vaughn  Procedure(s) Performed: IRRIGATION AND DEBRIDEMENT PERIRECTAL ABSCESS  Patient Location: PACU  Anesthesia Type:General  Level of Consciousness: drowsy and patient cooperative  Airway & Oxygen Therapy: Patient Spontanous Breathing and Patient connected to face mask oxygen  Post-op Assessment: Report given to RN and Post -op Vital signs reviewed and stable  Post vital signs: Reviewed and stable  Last Vitals:  Vitals Value Taken Time  BP 143/85 03/27/21 1630  Temp    Pulse 82 03/27/21 1631  Resp 19 03/27/21 1631  SpO2 100 % 03/27/21 1631  Vitals shown include unvalidated device data.  Last Pain:  Vitals:   03/27/21 1511  TempSrc: Oral  PainSc:          Complications: No notable events documented.

## 2021-03-27 NOTE — ED Provider Notes (Signed)
Patient with a perirectal abscess and fistula.  He also has poorly controlled diabetes.  I have consulted general surgery who will come see the patient but they requested medicine admission.  I have consult with medicine and they will admit the patient and take care of his diabetes   Bethann Berkshire, MD 03/27/21 [phone]

## 2021-03-27 NOTE — Anesthesia Procedure Notes (Signed)
Procedure Name: LMA Insertion Date/Time: 03/27/2021 3:41 PM Performed by: Epimenio Sarin, CRNA Pre-anesthesia Checklist: Patient identified, Emergency Drugs available, Suction available, Patient being monitored and Timeout performed Patient Re-evaluated:Patient Re-evaluated prior to induction Oxygen Delivery Method: Circle system utilized Preoxygenation: Pre-oxygenation with 100% oxygen Induction Type: IV induction Ventilation: Mask ventilation without difficulty LMA: LMA with gastric port inserted LMA Size: 4.0 Number of attempts: 1 Dental Injury: Teeth and Oropharynx as per pre-operative assessment

## 2021-03-28 ENCOUNTER — Encounter (HOSPITAL_COMMUNITY): Payer: Self-pay | Admitting: Surgery

## 2021-03-28 DIAGNOSIS — K603 Anal fistula: Secondary | ICD-10-CM | POA: Diagnosis not present

## 2021-03-28 DIAGNOSIS — I10 Essential (primary) hypertension: Secondary | ICD-10-CM

## 2021-03-28 LAB — COMPREHENSIVE METABOLIC PANEL
ALT: 21 U/L (ref 0–44)
AST: 18 U/L (ref 15–41)
Albumin: 3 g/dL — ABNORMAL LOW (ref 3.5–5.0)
Alkaline Phosphatase: 92 U/L (ref 38–126)
Anion gap: 11 (ref 5–15)
BUN: 6 mg/dL (ref 6–20)
CO2: 24 mmol/L (ref 22–32)
Calcium: 8.4 mg/dL — ABNORMAL LOW (ref 8.9–10.3)
Chloride: 99 mmol/L (ref 98–111)
Creatinine, Ser: 0.72 mg/dL (ref 0.61–1.24)
GFR, Estimated: >60 mL/min (ref >60)
Glucose, Bld: 324 mg/dL — ABNORMAL HIGH (ref 70–99)
Potassium: 3.3 mmol/L — ABNORMAL LOW (ref 3.5–5.1)
Sodium: 134 mmol/L — ABNORMAL LOW (ref 135–145)
Total Bilirubin: 1.2 mg/dL (ref 0.3–1.2)
Total Protein: 6.5 g/dL (ref 6.5–8.1)

## 2021-03-28 LAB — CBC WITH DIFFERENTIAL/PLATELET
Abs Immature Granulocytes: 0.03 10*3/uL (ref 0.00–0.07)
Basophils Absolute: 0 10*3/uL (ref 0.0–0.1)
Basophils Relative: 0 %
Eosinophils Absolute: 0.1 10*3/uL (ref 0.0–0.5)
Eosinophils Relative: 1 %
HCT: 39.9 % (ref 39.0–52.0)
Hemoglobin: 13.4 g/dL (ref 13.0–17.0)
Immature Granulocytes: 0 %
Lymphocytes Relative: 20 %
Lymphs Abs: 2.1 10*3/uL (ref 0.7–4.0)
MCH: 29.5 pg (ref 26.0–34.0)
MCHC: 33.6 g/dL (ref 30.0–36.0)
MCV: 87.7 fL (ref 80.0–100.0)
Monocytes Absolute: 0.6 10*3/uL (ref 0.1–1.0)
Monocytes Relative: 6 %
Neutro Abs: 7.8 10*3/uL — ABNORMAL HIGH (ref 1.7–7.7)
Neutrophils Relative %: 73 %
Platelets: 238 10*3/uL (ref 150–400)
RBC: 4.55 MIL/uL (ref 4.22–5.81)
RDW: 13 % (ref 11.5–15.5)
WBC: 10.7 10*3/uL — ABNORMAL HIGH (ref 4.0–10.5)
nRBC: 0 % (ref 0.0–0.2)

## 2021-03-28 LAB — GLUCOSE, CAPILLARY
Glucose-Capillary: 253 mg/dL — ABNORMAL HIGH (ref 70–99)
Glucose-Capillary: 277 mg/dL — ABNORMAL HIGH (ref 70–99)
Glucose-Capillary: 259 mg/dL — ABNORMAL HIGH (ref 70–99)
Glucose-Capillary: 237 mg/dL — ABNORMAL HIGH (ref 70–99)

## 2021-03-28 LAB — HEMOGLOBIN A1C
Hgb A1c MFr Bld: 14.8 % — ABNORMAL HIGH (ref 4.8–5.6)
Mean Plasma Glucose: 378.06 mg/dL

## 2021-03-28 LAB — HIV ANTIBODY (ROUTINE TESTING W REFLEX): HIV Screen 4th Generation wRfx: NONREACTIVE

## 2021-03-28 MED ORDER — INSULIN ASPART 100 UNIT/ML IJ SOLN
5.0000 [IU] | Freq: Three times a day (TID) | INTRAMUSCULAR | Status: DC
  Administered 2021-03-28 (×2): 5 [IU] via SUBCUTANEOUS

## 2021-03-28 MED ORDER — POTASSIUM CHLORIDE CRYS ER 20 MEQ PO TBCR
60.0000 meq | EXTENDED_RELEASE_TABLET | Freq: Once | ORAL | Status: AC
  Administered 2021-03-28: 60 meq via ORAL
  Filled 2021-03-28: qty 3

## 2021-03-28 MED ORDER — INSULIN ASPART 100 UNIT/ML IJ SOLN: 0.0000 [IU] | Freq: Every day | INTRAMUSCULAR | Status: DC

## 2021-03-28 MED ORDER — BASAGLAR KWIKPEN 100 UNIT/ML ~~LOC~~ SOPN
25.0000 [IU] | PEN_INJECTOR | Freq: Every evening | SUBCUTANEOUS | Status: AC
Start: 2021-03-28 — End: ?

## 2021-03-28 MED ORDER — INSULIN GLARGINE-YFGN 100 UNIT/ML ~~LOC~~ SOLN
22.0000 [IU] | Freq: Every evening | SUBCUTANEOUS | Status: DC
  Filled 2021-03-28: qty 0.22

## 2021-03-28 MED ORDER — OXYCODONE HCL 5 MG PO TABS: 5.0000 mg | ORAL_TABLET | Freq: Four times a day (QID) | ORAL | 0 refills | Status: AC | PRN

## 2021-03-28 MED ORDER — FREESTYLE LIBRE 2 SENSOR MISC: 1.0000 | Freq: Every day | 0 refills | Status: AC

## 2021-03-28 MED ORDER — INSULIN ASPART 100 UNIT/ML IJ SOLN
0.0000 [IU] | Freq: Three times a day (TID) | INTRAMUSCULAR | Status: DC
  Administered 2021-03-28: 8 [IU] via SUBCUTANEOUS
  Administered 2021-03-28: 5 [IU] via SUBCUTANEOUS

## 2021-03-28 MED ORDER — AMOXICILLIN-POT CLAVULANATE 875-125 MG PO TABS: 1.0000 | ORAL_TABLET | Freq: Two times a day (BID) | ORAL | 0 refills | Status: AC

## 2021-03-28 NOTE — Progress Notes (Signed)
Transition of Care (TOC) Screening Note  Patient Details  Name: Cardarius Senat Date of Birth: 22-Jun-1980  Transition of Care [facility]) CM/SW Contact:    Ewing Schlein, LCSW Phone Number: 03/28/2021, 10:37 AM  Transition of Care Department [facility]) has reviewed patient and no TOC needs have been identified at this time. We will continue to monitor patient advancement through interdisciplinary progression rounds. If new patient transition needs arise, please place a TOC consult.

## 2021-03-28 NOTE — Discharge Summary (Signed)
Physician Discharge Summary  Kerry Vaughn IZT:[id number] DOB: 1981/02/19 DOA: 03/27/2021  PCP: [facility]  Admit date: 03/27/2021 Discharge date: 03/28/2021  Admitted From: Home Disposition:  Home  Recommendations for Outpatient Follow-up:  Follow up with PCP in 1-2 weeks Please follow up on glucose trends very closely and adjust insulin regimen as needed May consider referral to Endocrinologist  Follow up with General Surgery as scheduled  Discharge Condition:Improved CODE STATUS:Full Diet recommendation: Diabetic   Brief/Interim Summary: 40 y.o. male with medical history significant of class I obesity, chronic back pain, hypertension, hyperlipidemia, insomnia, polyneuropathy, type 2 diabetes who is coming to the emergency department due to progressively worse right buttock infection for the past 4 days associated with pain in the area and hyperglycemia.  He tried epsom salt baths and warm compresses to the area without relief.  He also tried draining it by inserting a needle in it.  The patient stated he disinfected it with an open plan for.  He also took some amoxicillin.  He attempted 3 4 with significant relief.  He CBG has been reading high at home.  His most recent hemoglobin A1c with his PCP was over 15% last week.  He has been having decreased appetite, polyuria and polydipsia at home in the past few days.Marland Kitchen  He denied fever, chills, night sweats, nausea, emesis, headache, rhinorrhea, sore throat, productive cough, dyspnea or hemoptysis.  No chest pain, palpitations, diaphoresis, PND, orthopnea or pitting edema of the lower extremities.  No abdominal pain, diarrhea, constipation, melena or hematochezia.  No flank pain, dysuria, frequency or hematuria  Discharge Diagnoses:  Principal Problem:   Perianal fistula Active Problems:   Essential (primary) hypertension   Polyneuropathy   Class 1 obesity   Type 2 diabetes mellitus with hyperglycemia (HCC)  Principal  Problem:   Perianal fistula General Surgery consulted Pt was continued on empiric Zosyn 3.375 g IV every 8 hours. Pt underwent I/D of R ischial rectal abscess on 12/15 General Surgery recs for 5 days of augmentin at time of d/c and OK for d/c today   Active Problems:   Type 2 diabetes mellitus with hyperglycemia (HCC) Hemoglobin A1c was more than 15 last week. Was initially continued on Semglee 15 units SQ every evening. Continued on SSI coverage while in hospital Appreciate diabetic coordinator input. Recs to d/c metformin, increase Basaglar from 15 units to 25 units, continue Ozempic Advised patient to f/u with PCP in one week Consider referral to Endocrinologist     Essential (primary) hypertension Currently not on medical therapy. BP had remained stable     Polyneuropathy Blood glucose control. Analgesics as needed per General Surgery.     Class 1 obesity Lifestyle modifications. Follow-up with PCP.    Discharge Instructions   Allergies as of 03/28/2021       Reactions   Tylenol [acetaminophen]         Medication List     STOP taking these medications    amoxicillin 500 MG capsule Commonly known as: AMOXIL   metFORMIN 500 MG 24 hr tablet Commonly known as: GLUCOPHAGE-XR   metFORMIN 500 MG tablet Commonly known as: GLUCOPHAGE       TAKE these medications    amoxicillin-clavulanate 875-125 MG tablet Commonly known as: Augmentin Take 1 tablet by mouth 2 (two) times daily for 5 days.   atorvastatin 40 MG tablet Commonly known as: LIPITOR Take 40 mg by mouth daily.   Basaglar KwikPen 100 UNIT/ML Inject 25 Units into the skin  every evening. What changed: how much to take   oxyCODONE 5 MG immediate release tablet Commonly known as: Oxy IR/ROXICODONE Take 1 tablet (5 mg total) by mouth every 6 (six) hours as needed for severe pain. What changed:  medication strength how much to take reasons to take this   Ozempic (1 MG/DOSE) 2 MG/1.5ML  Sopn Generic drug: Semaglutide (1 MG/DOSE) Inject 0.25 mg into the skin once a week.        Follow-up Information     Erroll Luna, MD. Go on 05/10/2021.   Specialty: General Surgery Why: at 9:00 AM for post-operative follow up. please arrive 30 minutes Arline. please call to confirm appointment date/time. Contact information: [address] [place] [address] [place] Follow up in 1 week(s).   Why: Hospital follow up Contact information: [place] [address] [phone]                Allergies  Allergen Reactions   Tylenol [Acetaminophen]     Consultations: General Surgery  Procedures/Studies: CT PELVIS W CONTRAST  Result Date: 03/27/2021 CLINICAL DATA:  40 year old male with history of anorectal abscess. EXAM: CT PELVIS WITH CONTRAST TECHNIQUE: Multidetector CT imaging of the pelvis was performed using the standard protocol following the bolus administration of intravenous contrast. CONTRAST:  1108mL OMNIPAQUE IOHEXOL 300 MG/ML  SOLN COMPARISON:  CT the abdomen and pelvis 08/02/2006. FINDINGS: Urinary Tract: Distal ureters and urinary bladder are normal in appearance. Bowel: No pathologic dilatation of visualized portions of small bowel or colon. Appendix is normal. Vascular/Lymphatic: No pathologically enlarged lymph nodes. No significant vascular abnormality seen. Reproductive: Prostate gland and seminal vesicles are unremarkable in appearance. Other: There appears to be a trace amount of fluid adjacent to the right-side of the distal rectum immediately before the anus, extending from approximately 7:00 to 10:00, which extends caudally and traverses the levator ani musculature. In the medial aspect of the right ischioanal space (axial image 55 of series 2 and coronal image 29 of series 4) there is a 5.6 x 2.7 x 4.9 cm gas and fluid collection, compatible with an abscess. This may connect  with the skin surface in the medial right gluteal cleft. Musculoskeletal: No aggressive appearing osseous lesions are noted in the visualized portions of the skeleton. Status post PLIF at L4-S1. IMPRESSION: 1. 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 Electronically Signed   By: Vinnie Langton M.D.   On: 03/27/2021 06:11    Subjective: Eager to go home  Discharge Exam: Vitals:   03/28/21 0910 03/28/21 1221  BP: 127/78 131/73  Pulse: 83 82  Resp: 18   Temp: 97.9 F (36.6 C) (!) 97.4 F (36.3 C)  SpO2: 96% 95%   Vitals:   03/28/21 0131 03/28/21 0614 03/28/21 0910 03/28/21 1221  BP: 114/64 107/64 127/78 131/73  Pulse: 80 85 83 82  Resp: 18 18 18    Temp: 98.2 F (36.8 C) 98.4 F (36.9 C) 97.9 F (36.6 C) (!) 97.4 F (36.3 C)  TempSrc: Oral Oral Oral   SpO2: 94% 100% 96% 95%  Weight:      Height:        General: Pt is alert, awake, not in acute distress Cardiovascular: RRR, S1/S2 + Respiratory: CTA bilaterally, no wheezing, no rhonchi Abdominal: Soft, NT, ND, bowel sounds + Extremities: no edema, no cyanosis   The results of significant diagnostics from this hospitalization (including  imaging, microbiology, ancillary and laboratory) are listed below for reference.     Microbiology: Recent Results (from the past 240 hour(s))  Resp Panel by RT-PCR (Flu A&B, Covid) Nasopharyngeal Swab     Status: None   Collection Time: 03/27/21  6:16 AM   Specimen: Nasopharyngeal Swab; Nasopharyngeal(NP) swabs in vial transport medium  Result Value Ref Range Status   SARS Coronavirus 2 by RT PCR NEGATIVE NEGATIVE Final    Comment: (NOTE) SARS-CoV-2 target nucleic acids are NOT DETECTED.  The SARS-CoV-2 RNA is generally detectable in upper respiratory specimens during the acute phase of infection. The lowest concentration of SARS-CoV-2 viral copies this assay can detect is 138 copies/mL. A negative result does not preclude SARS-Cov-2 infection and should not  be used as the sole basis for treatment or other patient management decisions. A negative result may occur with  improper specimen collection/handling, submission of specimen other than nasopharyngeal swab, presence of viral mutation(s) within the areas targeted by this assay, and inadequate number of viral copies(<138 copies/mL). A negative result must be combined with clinical observations, patient history, and epidemiological information. The expected result is Negative.  Fact Sheet for Patients:  BloggerCourse.com  Fact Sheet for Healthcare Providers:  SeriousBroker.it  This test is no t yet approved or cleared by the [place] FDA and  has been authorized for detection and/or diagnosis of SARS-CoV-2 by FDA under an Emergency Use Authorization (EUA). This EUA will remain  in effect (meaning this test can be used) for the duration of the COVID-19 declaration under Section 564(b)(1) of the Act, 21 U.S.C.section 360bbb-3(b)(1), unless the authorization is terminated  or revoked sooner.       Influenza A by PCR NEGATIVE NEGATIVE Final   Influenza B by PCR NEGATIVE NEGATIVE Final    Comment: (NOTE) The Xpert Xpress SARS-CoV-2/FLU/RSV plus assay is intended as an aid in the diagnosis of influenza from Nasopharyngeal swab specimens and should not be used as a sole basis for treatment. Nasal washings and aspirates are unacceptable for Xpert Xpress SARS-CoV-2/FLU/RSV testing.  Fact Sheet for Patients: BloggerCourse.com  Fact Sheet for Healthcare Providers: SeriousBroker.it  This test is not yet approved or cleared by the [place] FDA and has been authorized for detection and/or diagnosis of SARS-CoV-2 by FDA under an Emergency Use Authorization (EUA). This EUA will remain in effect (meaning this test can be used) for the duration of the COVID-19 declaration under Section  564(b)(1) of the Act, 21 U.S.C. section 360bbb-3(b)(1), unless the authorization is terminated or revoked.  Performed at [facility], [address] [place], [place] [address]   Aerobic Culture w Gram Stain (superficial specimen)     Status: None (Preliminary result)   Collection Time: 03/27/21  4:04 PM   Specimen: Abscess  Result Value Ref Range Status   Specimen Description   Final    ABSCESS PERIANAL Performed at [facility], [address] [place], [place] [address]    Special Requests   Final    NONE Performed at [facility], [address], [place], [place] [address]    Gram Stain PENDING  Incomplete   Culture   Final    CULTURE REINCUBATED FOR BETTER GROWTH Performed at [facility], [address], [place], [place] [address]    Report Status PENDING  Incomplete  Anaerobic culture w Gram Stain     Status: None (Preliminary result)   Collection Time: 03/27/21  4:04 PM   Specimen:  Abscess  Result Value Ref Range Status   Specimen Description   Final    ABSCESS PERIANAL Performed at [place] [address], [place], [place] [address]    Special Requests   Final    NONE Performed at [facility], [place] [address], [place], [place] [address]    Gram Stain   Final    NO SQUAMOUS EPITHELIAL CELLS SEEN FEW WBC SEEN FEW GRAM POSITIVE COCCI Performed at [facility], [place] [address] [place], [place] [address]    Culture PENDING  Incomplete   Report Status PENDING  Incomplete     Labs: BNP (last 3 results) No results for input(s): BNP in the last 8760 hours. Basic Metabolic Panel: Recent Labs  Lab 03/27/21 0427 03/28/21 0426  NA 131* 134*  K 3.5 3.3*  CL 96* 99  CO2 24 24  GLUCOSE 464* 324*  BUN 9 6  CREATININE 0.86 0.72  CALCIUM 9.4 8.4*   Liver Function Tests: Recent Labs  Lab 03/28/21 0426  AST 18  ALT 21  ALKPHOS 92  BILITOT 1.2   PROT 6.5  ALBUMIN 3.0*   No results for input(s): LIPASE, AMYLASE in the last 168 hours. No results for input(s): AMMONIA in the last 168 hours. CBC: Recent Labs  Lab 03/27/21 0427 03/28/21 0426  WBC 12.8* 10.7*  NEUTROABS 10.1* 7.8*  HGB 14.6 13.4  HCT 42.4 39.9  MCV 84.3 87.7  PLT 250 238   Cardiac Enzymes: No results for input(s): CKTOTAL, CKMB, CKMBINDEX, TROPONINI in the last 168 hours. BNP: Invalid input(s): POCBNP CBG: Recent Labs  Lab 03/27/21 1640 03/27/21 2353 03/28/21 0612 03/28/21 0758 03/28/21 1118  GLUCAP 204* 253* 277* 259* 237*   D-Dimer No results for input(s): DDIMER in the last 72 hours. Hgb A1c Recent Labs    03/28/21 0426  HGBA1C 14.8*   Lipid Profile No results for input(s): CHOL, HDL, LDLCALC, TRIG, CHOLHDL, LDLDIRECT in the last 72 hours. Thyroid function studies No results for input(s): TSH, T4TOTAL, T3FREE, THYROIDAB in the last 72 hours.  Invalid input(s): FREET3 Anemia work up No results for input(s): VITAMINB12, FOLATE, FERRITIN, TIBC, IRON, RETICCTPCT in the last 72 hours. Urinalysis    Component Value Date/Time   COLORURINE YELLOW 11/22/2020 1711   APPEARANCEUR CLEAR 11/22/2020 1711   LABSPEC >1.046 (H) 11/22/2020 1711   PHURINE 5.5 11/22/2020 1711   GLUCOSEU >1,000 (A) 11/22/2020 1711   HGBUR NEGATIVE 11/22/2020 1711   BILIRUBINUR NEGATIVE 11/22/2020 1711   KETONESUR 40 (A) 11/22/2020 1711   PROTEINUR TRACE (A) 11/22/2020 1711   UROBILINOGEN 0.2 04/04/2013 2050   NITRITE NEGATIVE 11/22/2020 1711   LEUKOCYTESUR NEGATIVE 11/22/2020 1711   Sepsis Labs Invalid input(s): PROCALCITONIN,  WBC,  LACTICIDVEN Microbiology Recent Results (from the past 240 hour(s))  Resp Panel by RT-PCR (Flu A&B, Covid) Nasopharyngeal Swab     Status: None   Collection Time: 03/27/21  6:16 AM   Specimen: Nasopharyngeal Swab; Nasopharyngeal(NP) swabs in vial transport medium  Result Value Ref Range Status   SARS Coronavirus 2 by RT PCR  NEGATIVE NEGATIVE Final    Comment: (NOTE) SARS-CoV-2 target nucleic acids are NOT DETECTED.  The SARS-CoV-2 RNA is generally detectable in upper respiratory specimens during the acute phase of infection. The lowest concentration of SARS-CoV-2 viral copies this assay can detect is 138 copies/mL. A negative result does not preclude SARS-Cov-2 infection and should not be used as the sole basis for treatment or other patient  management decisions. A negative result may occur with  improper specimen collection/handling, submission of specimen other than nasopharyngeal swab, presence of viral mutation(s) within the areas targeted by this assay, and inadequate number of viral copies(<138 copies/mL). A negative result must be combined with clinical observations, patient history, and epidemiological information. The expected result is Negative.  Fact Sheet for Patients:  EntrepreneurPulse.com.au  Fact Sheet for Healthcare Providers:  IncredibleEmployment.be  This test is no t yet approved or cleared by the [place] FDA and  has been authorized for detection and/or diagnosis of SARS-CoV-2 by FDA under an Emergency Use Authorization (EUA). This EUA will remain  in effect (meaning this test can be used) for the duration of the COVID-19 declaration under Section 564(b)(1) of the Act, 21 U.S.C.section 360bbb-3(b)(1), unless the authorization is terminated  or revoked sooner.       Influenza A by PCR NEGATIVE NEGATIVE Final   Influenza B by PCR NEGATIVE NEGATIVE Final    Comment: (NOTE) The Xpert Xpress SARS-CoV-2/FLU/RSV plus assay is intended as an aid in the diagnosis of influenza from Nasopharyngeal swab specimens and should not be used as a sole basis for treatment. Nasal washings and aspirates are unacceptable for Xpert Xpress SARS-CoV-2/FLU/RSV testing.  Fact Sheet for Patients: EntrepreneurPulse.com.au  Fact Sheet for  Healthcare Providers: IncredibleEmployment.be  This test is not yet approved or cleared by the [place] FDA and has been authorized for detection and/or diagnosis of SARS-CoV-2 by FDA under an Emergency Use Authorization (EUA). This EUA will remain in effect (meaning this test can be used) for the duration of the COVID-19 declaration under Section 564(b)(1) of the Act, 21 U.S.C. section 360bbb-3(b)(1), unless the authorization is terminated or revoked.  Performed at [facility], [address] [place], [place] [address]   Aerobic Culture w Gram Stain (superficial specimen)     Status: None (Preliminary result)   Collection Time: 03/27/21  4:04 PM   Specimen: Abscess  Result Value Ref Range Status   Specimen Description   Final    ABSCESS PERIANAL Performed at [place] [address], [place], [place] [address]    Special Requests   Final    NONE Performed at [facility], [place] [address] [place], [place] [address]    Gram Stain PENDING  Incomplete   Culture   Final    CULTURE REINCUBATED FOR BETTER GROWTH Performed at [facility], [place] [address] [place], [place] [address]    Report Status PENDING  Incomplete  Anaerobic culture w Gram Stain     Status: None (Preliminary result)   Collection Time: 03/27/21  4:04 PM   Specimen: Abscess  Result Value Ref Range Status   Specimen Description   Final    ABSCESS PERIANAL Performed at [place] [address] [place], [place] [address]    Special Requests   Final    NONE Performed at [facility], [place] [address], [place], [place] [address]    Gram Stain   Final    NO SQUAMOUS EPITHELIAL CELLS SEEN FEW WBC SEEN FEW GRAM POSITIVE COCCI Performed at [facility], [place] [address], [place], [place] [address]    Culture PENDING  Incomplete   Report Status PENDING  Incomplete   Time  spent: 56min  SIGNED:   Marylu Lund, MD  [facility] 03/28/2021, 1:27 PM  If 7PM-7AM, please contact night-coverage

## 2021-03-28 NOTE — Discharge Instructions (Signed)
Please contact your PCP for blood glucose over 200

## 2021-03-28 NOTE — Progress Notes (Signed)
Discharge instructions given to patient and all questions were answered.  

## 2021-03-28 NOTE — Progress Notes (Addendum)
[facility] Progress Note  1 Day Post-Op  Subjective: CC:  NAEO. Pain controlled on PO pain meds. Tolerating PO. Partner at bedside.  Objective: Vital signs in last 24 hours: Temp:  [97.9 F (36.6 C)-98.8 F (37.1 C)] 97.9 F (36.6 C) (12/15 0910) Pulse Rate:  [80-91] 83 (12/15 0910) Resp:  [16-18] 18 (12/15 0910) BP: (107-149)/(61-95) 127/78 (12/15 0910) SpO2:  [93 %-100 %] 96 % (12/15 0910)    Intake/Output from previous day: 12/14 0701 - 12/15 0700 In: 4169 [P.O.:820; I.V.:1250; IV Piggyback:2099] Out: 20 [Blood:20] Intake/Output this shift: Total I/O In: 502.2 [P.O.:120; I.V.:375; IV Piggyback:7.2] Out: -   PE: Gen:  Alert, NAD, pleasant Card:  Regular rate and rhythm Pulm:  Normal effort ORA Abd: Soft, non-tender, non-distended Right buttock: packing removed from right perianal wound ~3x4x2 cm. Wound base is pink with minimal sanguinous oozing and wound edge - controlled with pressure. No tunneling/tracking/undermining. Mild cellulitis/induration laterally. Skin: warm and dry, no rashes  Psych: A&Ox3   Lab Results:  Recent Labs    03/27/21 0427 03/28/21 0426  WBC 12.8* 10.7*  HGB 14.6 13.4  HCT 42.4 39.9  PLT 250 238   BMET Recent Labs    03/27/21 0427 03/28/21 0426  NA 131* 134*  K 3.5 3.3*  CL 96* 99  CO2 24 24  GLUCOSE 464* 324*  BUN 9 6  CREATININE 0.86 0.72  CALCIUM 9.4 8.4*   PT/INR No results for input(s): LABPROT, INR in the last 72 hours. CMP     Component Value Date/Time   NA 134 (L) 03/28/2021 0426   K 3.3 (L) 03/28/2021 0426   CL 99 03/28/2021 0426   CO2 24 03/28/2021 0426   GLUCOSE 324 (H) 03/28/2021 0426   BUN 6 03/28/2021 0426   CREATININE 0.72 03/28/2021 0426   CALCIUM 8.4 (L) 03/28/2021 0426   PROT 6.5 03/28/2021 0426   ALBUMIN 3.0 (L) 03/28/2021 0426   AST 18 03/28/2021 0426   ALT 21 03/28/2021 0426   ALKPHOS 92 03/28/2021 0426   BILITOT 1.2 03/28/2021 0426   GFRNONAA >60 03/28/2021 0426   GFRAA >90  04/04/2013 2115   Lipase     Component Value Date/Time   LIPASE 24 04/04/2013 2115       Studies/Results: CT PELVIS W CONTRAST  Result Date: 03/27/2021 CLINICAL DATA:  41 year old male with history of anorectal abscess. EXAM: CT PELVIS WITH CONTRAST TECHNIQUE: Multidetector CT imaging of the pelvis was performed using the standard protocol following the bolus administration of intravenous contrast. CONTRAST:  OMNIPAQUE IOHEXOL 300 MG/ML  SOLN COMPARISON:  CT the abdomen and pelvis 08/02/2006. FINDINGS: Urinary Tract: Distal ureters and urinary bladder are normal in appearance. Bowel: No pathologic dilatation of visualized portions of small bowel or colon. Appendix is normal. Vascular/Lymphatic: No pathologically enlarged lymph nodes. No significant vascular abnormality seen. Reproductive: Prostate gland and seminal vesicles are unremarkable in appearance. Other: There appears to be a trace amount of fluid adjacent to the right-side of the distal rectum immediately before the anus, extending from approximately 7:00 to 10:00, which extends caudally and traverses the levator ani musculature. In the medial aspect of the right ischioanal space (axial image 55 of series 2 and coronal image 29 of series 4) there is a 5.6 x 2.7 x 4.9 cm gas and fluid collection, compatible with an abscess. This may connect with the skin surface in the medial right gluteal cleft. Musculoskeletal: No aggressive appearing osseous lesions are noted in the visualized  portions of the skeleton. Status post PLIF at L4-S1. IMPRESSION: 1. 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 Electronically Signed   By: Trudie Reed M.D.   On: 03/27/2021 06:11    Anti-infectives: Anti-infectives (From admission, onward)    Start     Dose/Rate Route Frequency Ordered Stop   03/28/21 0000  amoxicillin-clavulanate (AUGMENTIN) 875-125 MG tablet        1 tablet Oral 2 times daily 03/28/21 0937 04/02/21 2359    03/27/21 1600  piperacillin-tazobactam (ZOSYN) IVPB 3.375 g        3.375 g 12.5 mL/hr over 240 Minutes Intravenous Every 8 hours 03/27/21 1009     03/27/21 1400  piperacillin-tazobactam (ZOSYN) IVPB 3.375 g  Status:  Discontinued        3.375 g 100 mL/hr over 30 Minutes Intravenous Every 8 hours 03/27/21 1004 03/27/21 1008   03/27/21 0700  piperacillin-tazobactam (ZOSYN) IVPB 3.375 g        3.375 g 100 mL/hr over 30 Minutes Intravenous  Once 03/27/21 0659 03/27/21 0745        Assessment/Plan  Perianal abscess POD#1 s/p I&D Dr. Luisa Hart - afebrile, WBC down-trending - packing removed. Sitz baths TID and after BM. No need to pack wound at home. Augmentin for 5 days. Wound care discussed with patient and partner. - stable for D/C from [facility] perspective - follow up, pain meds, and abx prescribed by me.     LOS: 0 days    Hosie Spangle, [facility] Please see Amion for pager number during day hours 7:00am-4:30pm

## 2021-03-28 NOTE — Progress Notes (Signed)
Inpatient Diabetes Program Recommendations  AACE/ADA: New Consensus Statement on Inpatient Glycemic Control (2015)  Target Ranges:  Prepandial:   less than 140 mg/dL      Peak postprandial:   less than 180 mg/dL (1-2 hours)      Critically ill patients:  140 - 180 mg/dL   Lab Results  Component Value Date   GLUCAP 277 (H) 03/28/2021   HGBA1C 10.6 (H) 11/22/2020    Review of Glycemic Control  Latest Reference Range & Units 03/27/21 15:10 03/27/21 16:40 03/27/21 23:53 03/28/21 06:12  Glucose-Capillary 70 - 99 mg/dL 630 (H) 160 (H) 109 (H) 277 (H)   Diabetes history: DM 2 (fairly new diagnosis) Outpatient Diabetes medications: Metformin 500 mg bid Current orders for Inpatient glycemic control:  Semglee 15 units Novolog 0-9 units tid  A1c 10.6 on 11/22/20, based on level of A1c needed insulin at that time  Inpatient Diabetes Program Recommendations:    -  Increase Semglee 22 units   Out of state medicaid [facility]  Spoke with pt and wife at bedside on 12/14. Pt would benefit from a Freestyle Libre at time of d/c.   Thanks,  Christena Deem RN, MSN, BC-ADM Inpatient Diabetes Coordinator Team Pager [phone] (8a-5p)

## 2021-03-30 LAB — AEROBIC CULTURE W GRAM STAIN (SUPERFICIAL SPECIMEN)

## 2021-03-31 ENCOUNTER — Encounter (HOSPITAL_COMMUNITY): Payer: Self-pay | Admitting: Oncology

## 2021-03-31 ENCOUNTER — Other Ambulatory Visit: Payer: Self-pay

## 2021-03-31 ENCOUNTER — Emergency Department (HOSPITAL_COMMUNITY)
Admission: EM | Admit: 2021-03-31 | Discharge: 2021-03-31 | Disposition: A | Discharge status: Home / Self Care | Payer: Medicaid Other | Attending: Emergency Medicine | Admitting: Emergency Medicine

## 2021-03-31 DIAGNOSIS — R7309 Other abnormal glucose: Secondary | ICD-10-CM | POA: Insufficient documentation

## 2021-03-31 DIAGNOSIS — L0231 Cutaneous abscess of buttock: Secondary | ICD-10-CM | POA: Insufficient documentation

## 2021-03-31 DIAGNOSIS — Z5321 Procedure and treatment not carried out due to patient leaving prior to being seen by health care provider: Secondary | ICD-10-CM | POA: Diagnosis not present

## 2021-03-31 LAB — CBC WITH DIFFERENTIAL/PLATELET
Abs Immature Granulocytes: 0.07 10*3/uL (ref 0.00–0.07)
Basophils Absolute: 0 10*3/uL (ref 0.0–0.1)
Basophils Relative: 0 %
Eosinophils Absolute: 0.1 10*3/uL (ref 0.0–0.5)
Eosinophils Relative: 1 %
HCT: 45.2 % (ref 39.0–52.0)
Hemoglobin: 15.2 g/dL (ref 13.0–17.0)
Immature Granulocytes: 1 %
Lymphocytes Relative: 24 %
Lymphs Abs: 2.4 10*3/uL (ref 0.7–4.0)
MCH: 29.3 pg (ref 26.0–34.0)
MCHC: 33.6 g/dL (ref 30.0–36.0)
MCV: 87.1 fL (ref 80.0–100.0)
Monocytes Absolute: 0.5 10*3/uL (ref 0.1–1.0)
Monocytes Relative: 5 %
Neutro Abs: 7 10*3/uL (ref 1.7–7.7)
Neutrophils Relative %: 69 %
Platelets: 406 10*3/uL — ABNORMAL HIGH (ref 150–400)
RBC: 5.19 MIL/uL (ref 4.22–5.81)
RDW: 12.7 % (ref 11.5–15.5)
WBC: 10.1 10*3/uL (ref 4.0–10.5)
nRBC: 0 % (ref 0.0–0.2)

## 2021-03-31 LAB — BASIC METABOLIC PANEL
Anion gap: 8 (ref 5–15)
BUN: 8 mg/dL (ref 6–20)
CO2: 20 mmol/L — ABNORMAL LOW (ref 22–32)
Calcium: 8.6 mg/dL — ABNORMAL LOW (ref 8.9–10.3)
Chloride: 103 mmol/L (ref 98–111)
Creatinine, Ser: 0.86 mg/dL (ref 0.61–1.24)
GFR, Estimated: >60 mL/min (ref >60)
Glucose, Bld: 367 mg/dL — ABNORMAL HIGH (ref 70–99)
Potassium: 3.9 mmol/L (ref 3.5–5.1)
Sodium: 131 mmol/L — ABNORMAL LOW (ref 135–145)

## 2021-03-31 LAB — CBG MONITORING, ED: Glucose-Capillary: 349 mg/dL — ABNORMAL HIGH (ref 70–99)

## 2021-03-31 NOTE — ED Triage Notes (Signed)
Pt has an abscess to right buttock that has been operated on.  Pt has had consistently high CBG's, Spoke w/ diabetic coordinator who advised increasing insulin to 30 units however CBG remained elevated.  Encouraged to bring to ED.

## 2021-03-31 NOTE — ED Notes (Signed)
Informed pt of the longest wait time in order to manage expectation.  Explained that care would begin here in triage.  Pt spoke approximately 5 words during triage until that time.  Pt became verbally abusive stating, "I won't wait no five hours."  Attempts to deescalate unsuccessful. [place], CN to room.

## 2021-03-31 NOTE — ED Provider Notes (Signed)
Emergency Medicine Provider Triage Evaluation Note  Lea Walbert , a 40 y.o. male  was evaluated in triage.  Pt complains of increased blood sugars.  He had recent admission/surgery for a perirectal abscess and fistula.  He is on antibiotics.  No recent fevers.  He has been feeling weak and nauseated since this surgery.  He is notices blood sugars a bit elevated over 400.  His diabetic coordinator advised him to come in for treatment.  Review of Systems  Positive: Fatigue, nausea Negative: Fevers, vomiting  Physical Exam  BP (!) 141/85 (BP Location: Right Arm)    Pulse 86    Temp 98.6 F (37 C) (Oral)    Resp 18    SpO2 98%  Gen:   Awake, no distress    Resp:  Normal effort   MSK:   Moves extremities without difficulty   Other:     Medical Decision Making  Medically screening exam initiated at 10:16 AM.  Appropriate orders placed.  Kayman Lebeda was informed that the remainder of the evaluation will be completed by another provider, this initial triage assessment does not replace that evaluation, and the importance of remaining in the ED until their evaluation is complete.      Rolan Bucco, MD 03/31/21 1017

## 2021-04-01 ENCOUNTER — Encounter (HOSPITAL_BASED_OUTPATIENT_CLINIC_OR_DEPARTMENT_OTHER): Payer: Self-pay

## 2021-04-01 ENCOUNTER — Other Ambulatory Visit: Payer: Self-pay

## 2021-04-01 ENCOUNTER — Emergency Department (HOSPITAL_BASED_OUTPATIENT_CLINIC_OR_DEPARTMENT_OTHER)
Admission: EM | Admit: 2021-04-01 | Discharge: 2021-04-01 | Disposition: A | Discharge status: Home / Self Care | Payer: Medicaid Other | Attending: Emergency Medicine | Admitting: Emergency Medicine

## 2021-04-01 ENCOUNTER — Emergency Department (HOSPITAL_BASED_OUTPATIENT_CLINIC_OR_DEPARTMENT_OTHER): Payer: Medicaid Other

## 2021-04-01 DIAGNOSIS — I10 Essential (primary) hypertension: Secondary | ICD-10-CM | POA: Diagnosis not present

## 2021-04-01 DIAGNOSIS — Z794 Long term (current) use of insulin: Secondary | ICD-10-CM | POA: Diagnosis not present

## 2021-04-01 DIAGNOSIS — E119 Type 2 diabetes mellitus without complications: Secondary | ICD-10-CM | POA: Insufficient documentation

## 2021-04-01 DIAGNOSIS — X58XXXA Exposure to other specified factors, initial encounter: Secondary | ICD-10-CM | POA: Diagnosis not present

## 2021-04-01 DIAGNOSIS — F17210 Nicotine dependence, cigarettes, uncomplicated: Secondary | ICD-10-CM | POA: Insufficient documentation

## 2021-04-01 DIAGNOSIS — S31829A Unspecified open wound of left buttock, initial encounter: Secondary | ICD-10-CM | POA: Diagnosis not present

## 2021-04-01 DIAGNOSIS — Z4801 Encounter for change or removal of surgical wound dressing: Secondary | ICD-10-CM | POA: Diagnosis not present

## 2021-04-01 DIAGNOSIS — K6289 Other specified diseases of anus and rectum: Secondary | ICD-10-CM | POA: Insufficient documentation

## 2021-04-01 DIAGNOSIS — Z4889 Encounter for other specified surgical aftercare: Secondary | ICD-10-CM

## 2021-04-01 LAB — CBC WITH DIFFERENTIAL/PLATELET
Abs Immature Granulocytes: 0.07 10*3/uL (ref 0.00–0.07)
Basophils Absolute: 0 10*3/uL (ref 0.0–0.1)
Basophils Relative: 0 %
Eosinophils Absolute: 0 10*3/uL (ref 0.0–0.5)
Eosinophils Relative: 0 %
HCT: 42.5 % (ref 39.0–52.0)
Hemoglobin: 14.1 g/dL (ref 13.0–17.0)
Immature Granulocytes: 1 %
Lymphocytes Relative: 32 %
Lymphs Abs: 3 10*3/uL (ref 0.7–4.0)
MCH: 28.8 pg (ref 26.0–34.0)
MCHC: 33.2 g/dL (ref 30.0–36.0)
MCV: 86.9 fL (ref 80.0–100.0)
Monocytes Absolute: 0.6 10*3/uL (ref 0.1–1.0)
Monocytes Relative: 6 %
Neutro Abs: 5.7 10*3/uL (ref 1.7–7.7)
Neutrophils Relative %: 61 %
Platelets: 382 10*3/uL (ref 150–400)
RBC: 4.89 MIL/uL (ref 4.22–5.81)
RDW: 12.8 % (ref 11.5–15.5)
WBC: 9.5 10*3/uL (ref 4.0–10.5)
nRBC: 0 % (ref 0.0–0.2)

## 2021-04-01 LAB — BASIC METABOLIC PANEL
Anion gap: 6 (ref 5–15)
BUN: 8 mg/dL (ref 6–20)
CO2: 27 mmol/L (ref 22–32)
Calcium: 9.4 mg/dL (ref 8.9–10.3)
Chloride: 100 mmol/L (ref 98–111)
Creatinine, Ser: 0.84 mg/dL (ref 0.61–1.24)
GFR, Estimated: >60 mL/min (ref >60)
Glucose, Bld: 290 mg/dL — ABNORMAL HIGH (ref 70–99)
Potassium: 3.8 mmol/L (ref 3.5–5.1)
Sodium: 133 mmol/L — ABNORMAL LOW (ref 135–145)

## 2021-04-01 LAB — I-STAT VENOUS BLOOD GAS, ED
Acid-Base Excess: 0 mmol/L (ref 0.0–2.0)
Bicarbonate: 25.7 mmol/L (ref 20.0–28.0)
Calcium, Ion: 1.23 mmol/L (ref 1.15–1.40)
HCT: 43 % (ref 39.0–52.0)
Hemoglobin: 14.6 g/dL (ref 13.0–17.0)
O2 Saturation: 46 %
Potassium: 4 mmol/L (ref 3.5–5.1)
Sodium: 137 mmol/L (ref 135–145)
TCO2: 27 mmol/L (ref 22–32)
pCO2, Ven: 43.4 mmHg — ABNORMAL LOW (ref 44.0–60.0)
pH, Ven: 7.38 (ref 7.250–7.430)
pO2, Ven: 26 mmHg — CL (ref 32.0–45.0)

## 2021-04-01 LAB — CBG MONITORING, ED: Glucose-Capillary: 386 mg/dL — ABNORMAL HIGH (ref 70–99)

## 2021-04-01 LAB — LACTIC ACID, PLASMA: Lactic Acid, Venous: 0.9 mmol/L (ref 0.5–1.9)

## 2021-04-01 MED ORDER — IOHEXOL 300 MG/ML  SOLN
100.0000 mL | Freq: Once | INTRAMUSCULAR | Status: AC | PRN
  Administered 2021-04-01: 19:00:00 100 mL via INTRAVENOUS

## 2021-04-01 MED ORDER — ONDANSETRON HCL 4 MG/2ML IJ SOLN
4.0000 mg | Freq: Once | INTRAMUSCULAR | Status: AC
  Administered 2021-04-01: 20:00:00 4 mg via INTRAVENOUS
  Filled 2021-04-01: qty 2

## 2021-04-01 MED ORDER — SODIUM CHLORIDE 0.9 % IV BOLUS
1000.0000 mL | Freq: Once | INTRAVENOUS | Status: AC
  Administered 2021-04-01: 20:00:00 1000 mL via INTRAVENOUS

## 2021-04-01 MED ORDER — FENTANYL CITRATE PF 50 MCG/ML IJ SOSY
100.0000 ug | PREFILLED_SYRINGE | Freq: Once | INTRAMUSCULAR | Status: AC
  Administered 2021-04-01: 20:00:00 100 ug via INTRAVENOUS
  Filled 2021-04-01: qty 2

## 2021-04-01 NOTE — Discharge Instructions (Addendum)
Continue with the antibiotic that you were given when you are discharged from the hospital.  You will need to do wet-to-dry dressings at home.  Monitor the wound closely.  Call the surgeon tomorrow to schedule an appointment for follow-up.  Please return to the emergency department for any new or worsening symptoms in the meantime.

## 2021-04-01 NOTE — ED Provider Notes (Signed)
[place] EMERGENCY DEPT Provider Note   CSN: [id number] Arrival date & time: 04/01/21  1701     History Chief Complaint  Patient presents with   Wound Check   Hyperglycemia    Kerry Vaughn is a 40 y.o. male.  HPI  40 year old male with a history of hypertension, diabetes, perianal fistula, presents for evaluation of wound check.  Patient was recently admitted and had surgery on a perianal abscess.  Since being discharged home he has had increased drainage from the wound and some discoloration.  They were told by their home nurse that patient needed to come to the ED for reassessment.  He has been compliant with the antibiotics that he was discharged home with.  He has had sweats and chills, nausea.  No vomiting.  He has had elevated blood sugars at home.  He does have some increased pain at the area of the wound.  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   Type 2 diabetes mellitus ([place]) 03/27/2021   Class 1 obesity 03/27/2021   Type 2 diabetes mellitus with hyperglycemia ([place]) 03/27/2021   Polyneuropathy 11/19/2020   Essential (primary) hypertension 07/30/2020    Past Surgical History:  Procedure Laterality Date   INCISION AND DRAINAGE PERIRECTAL ABSCESS N/A 03/27/2021   Procedure: IRRIGATION AND DEBRIDEMENT PERIRECTAL ABSCESS;  Surgeon: Erroll Luna, MD;  Location: [facility];  Service: General;  Laterality: N/A;       Family History  Problem Relation Age of Onset   Hypertension Other    Diabetes Other     Social  History   Tobacco Use   Smoking status: Every Day    Types: Cigarettes    Passive exposure: Current   Smokeless tobacco: Never  Vaping Use   Vaping Use: Never used  Substance Use Topics   Alcohol use: Not Currently   Drug use: No    Home Medications Prior to Admission medications   Medication Sig Start Date End Date Taking? Authorizing Provider  amoxicillin-clavulanate (AUGMENTIN) 875-125 MG tablet Take 1 tablet by mouth 2 (two) times daily for 5 days. 03/28/21 04/02/21  Jill Alexanders, PA-C  atorvastatin (LIPITOR) 40 MG tablet Take 40 mg by mouth daily. 01/16/21   [provider]  Continuous Blood Gluc Sensor (FREESTYLE LIBRE 2 SENSOR) MISC 1 Device by Does not apply route daily. 03/28/21   Donne Hazel, MD  Insulin Glargine [facility]) 100 UNIT/ML Inject 25 Units into the skin every evening. 03/28/21   Donne Hazel, MD  oxyCODONE (OXY IR/ROXICODONE) 5 MG immediate release tablet Take 1 tablet (5 mg total) by mouth every 6 (six) hours as needed for severe pain. 03/28/21   Jill Alexanders, PA-C  Semaglutide, 1 MG/DOSE, (OZEMPIC, 1 MG/DOSE,) 2 MG/1.5ML SOPN Inject 0.25 mg into the skin once a week.    [provider]    Allergies    Tylenol [acetaminophen]  Review of Systems   Review of Systems  Constitutional:  Positive for chills and diaphoresis.  HENT:  Negative for ear pain and sore throat.   Eyes:  Negative for visual disturbance.  Respiratory:  Negative for shortness of breath.   Cardiovascular:  Negative for chest pain.  Gastrointestinal:  Positive for nausea and rectal pain. Negative for abdominal pain, constipation, diarrhea and vomiting.  Genitourinary:  Negative for dysuria.  Musculoskeletal:  Negative for myalgias.  Skin:  Positive for color change and wound.  Neurological:  Negative for seizures and syncope.  All other systems reviewed and are negative.  Physical Exam Updated Vital Signs BP 110/66    Pulse 68    Temp 98.2  F (36.8 C)    Resp 18    Ht 6\' 1"  (1.854 m)    Wt 112 kg    SpO2 96%    BMI 32.58 kg/m   Physical Exam Vitals and nursing note reviewed.  Constitutional:      General: He is not in acute distress.    Appearance: He is well-developed.  HENT:     Head: Normocephalic and atraumatic.  Eyes:     Conjunctiva/sclera: Conjunctivae normal.  Cardiovascular:     Rate and Rhythm: Normal rate and regular rhythm.     Heart sounds: No murmur heard. Pulmonary:     Effort: Pulmonary effort is normal. No respiratory distress.     Breath sounds: Normal breath sounds.  Abdominal:     Palpations: Abdomen is soft.     Tenderness: There is no abdominal tenderness.  Genitourinary:    Comments: Chaperone present. Large open wound to the left buttock near the rectum. There is an area of necrosis around the surgical wound. No active purulent drainage noted on exam Musculoskeletal:        General: No swelling.     Cervical back: Neck supple.  Skin:    General: Skin is warm and dry.     Capillary Refill: Capillary refill takes less than 2 seconds.  Neurological:     Mental Status: He is alert.  Psychiatric:        Mood and Affect: Mood normal.         ED Results / Procedures / Treatments   Labs (all labs ordered are listed, but only abnormal results are displayed) Labs Reviewed  BASIC METABOLIC PANEL - Abnormal; Notable for the following components:      Result Value   Sodium 133 (*)    Glucose, Bld 290 (*)    All other components within normal limits  CBG MONITORING, ED - Abnormal; Notable for the following components:   Glucose-Capillary 386 (*)    All other components within normal limits  I-STAT VENOUS BLOOD GAS, ED - Abnormal; Notable for the following components:   pCO2, Ven 43.4 (*)    pO2, Ven 26.0 (*)    All other components within normal limits  CULTURE, BLOOD (ROUTINE X 2)  CULTURE, BLOOD (ROUTINE X 2)  CBC WITH DIFFERENTIAL/PLATELET  LACTIC ACID, PLASMA  LACTIC ACID,  PLASMA  BLOOD GAS, VENOUS    EKG None  Radiology CT PELVIS W CONTRAST  Result Date: 04/01/2021 CLINICAL DATA:  Perirectal abscess. EXAM: CT PELVIS WITH CONTRAST TECHNIQUE: Multidetector CT imaging of the pelvis was performed using the standard protocol following the bolus administration of intravenous contrast. CONTRAST:  169mL OMNIPAQUE IOHEXOL 300 MG/ML  SOLN COMPARISON:  March 27, 2021 FINDINGS: Urinary Tract:  No abnormality visualized. Bowel:  Unremarkable visualized pelvic bowel loops. Vascular/Lymphatic: No pathologically enlarged lymph nodes. No significant vascular abnormality seen. Reproductive:  No mass or other significant abnormality Other:  None. Musculoskeletal: A 2.9 cm  x 1.1 cm area of low attenuation (approximately 42.05 Hounsfield units) is seen within the subcutaneous fat along the posterior perirectal region on the right (axial CT image 51, CT series 2). A very mild amount of adjacent inflammatory fat stranding is seen. A trace amount of air is seen within the subcutaneous fat. This is markedly decreased in severity when compared to the prior exam. Bilateral metallic density pedicle screws are seen at the levels of L4, L5 and S1. IMPRESSION: 1. Small, posterior right-sided perirectal abscess, markedly decreased in severity when compared to the prior exam. 2. Postoperative changes within the lower lumbar spine. Electronically Signed   By: Aram Candela M.D.   On: 04/01/2021 19:46    Procedures Procedures   Medications Ordered in ED Medications  fentaNYL (SUBLIMAZE) injection 100 mcg (100 mcg Intravenous Given 04/01/21 1954)  ondansetron (ZOFRAN) injection 4 mg (4 mg Intravenous Given 04/01/21 1953)  sodium chloride 0.9 % bolus 1,000 mL (1,000 mLs Intravenous New Bag/Given 04/01/21 2000)  iohexol (OMNIPAQUE) 300 MG/ML solution 100 mL (100 mLs Intravenous Contrast Given 04/01/21 1916)    ED Course  I have reviewed the triage vital signs and the nursing  notes.  Pertinent labs & imaging results that were available during my care of the patient were reviewed by me and considered in my medical decision making (see chart for details).    MDM Rules/Calculators/A&P                          40 y/o male presents for eval of wound and hyperglycemia  Reviewed/interpreted labs CBC w/o leukocytosis or anemia BMP with hyperglycemia, no evidence of dka Lactic acid negative Blood cultures obtained  VBG without acidemia  8:31 PM CONSULT with Dr. Sheliah Hatch with general surgery who with review imaging and media in epic and call back   Dr. Magnus Ivan reviewed pictures of the wound as well as the CT scan and did not feel that patient required further debridement at this time.  Recommended wet-to-dry dressings and sits baths at home.  Reassessed patient.  He is feeling some improvement after pain medications in the ED.  We discussed treatment with sitz bath's, with wet-to-dry dressings and continuation of his antibiotics.  Recommend that he follow-up closely with his general surgeon for further evaluation as an outpatient.  Advised on return precautions.  He and wife at bedside voiced understanding the plan and reasons to return.  All questions answered.  Patient stable for discharge.   Final Clinical Impression(s) / ED Diagnoses Final diagnoses:  Encounter for postoperative wound check    Rx / DC Orders ED Discharge Orders     None        Karrie Meres, PA-C 04/01/21 2127    Tanda Rockers A, DO 04/03/21 0024

## 2021-04-01 NOTE — ED Triage Notes (Signed)
Patient here POV from Home with Hyperglycemia and Wound.  Patient had Surgical I&D on 03/27/21. Patient has concerns regarding Wound and also concerns with Hyperglycemia.   NAD Noted during Triage. A&Ox4. GCS 15. Ambulatory.

## 2021-04-03 LAB — ANAEROBIC CULTURE W GRAM STAIN: Gram Stain: NONE SEEN

## 2021-04-07 LAB — CULTURE, BLOOD (ROUTINE X 2)
Culture: NO GROWTH
Special Requests: ADEQUATE

## 2021-08-01 ENCOUNTER — Encounter: Payer: Medicaid Other | Attending: Physician Assistant | Admitting: Dietician

## 2022-06-03 IMAGING — CT CT PELVIS W/ CM
2 of 3 series · 16 of 46 positions shown, 18 images · IV contrast (APPLIED)
Comparison: CT the abdomen and pelvis 08/02/2006.

CLINICAL DATA: 40-year-old male with history of anorectal abscess.

EXAM:
CT PELVIS WITH CONTRAST
TECHNIQUE: Multidetector CT imaging of the pelvis was performed using the
standard protocol following the bolus administration of intravenous
contrast.
CONTRAST:  100mL OMNIPAQUE IOHEXOL 300 MG/ML  SOLN

[Series 2: pelvis w · axial · 0.81mm/px · z∈[+796,+1072]mm · 13 of 65 slices shown, 15 images]
[im 5/65  soft-tissue]
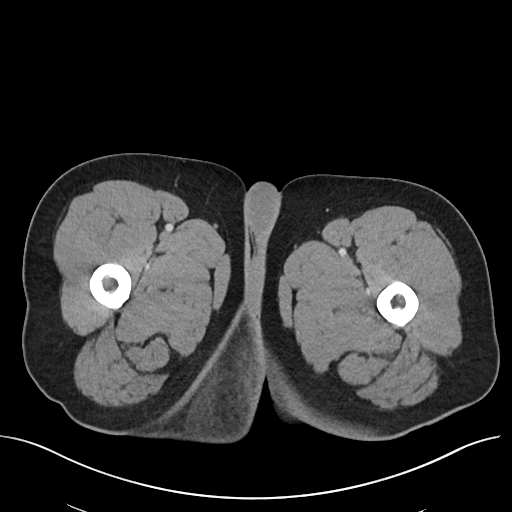
[im 5/65  bone]
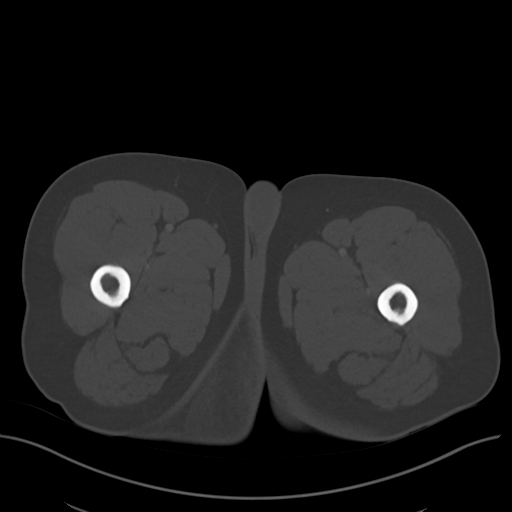
[im 9/65  soft-tissue]
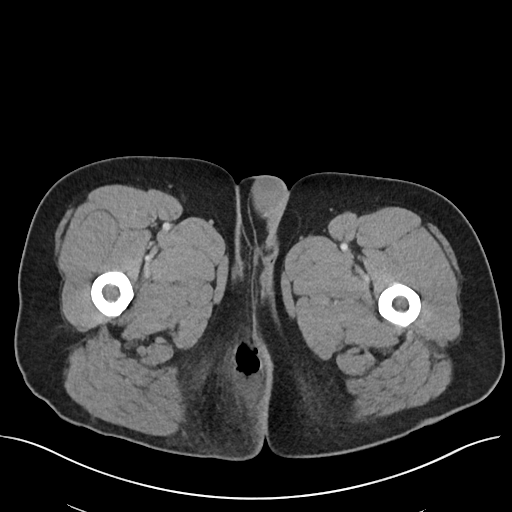
[im 13/65  soft-tissue]
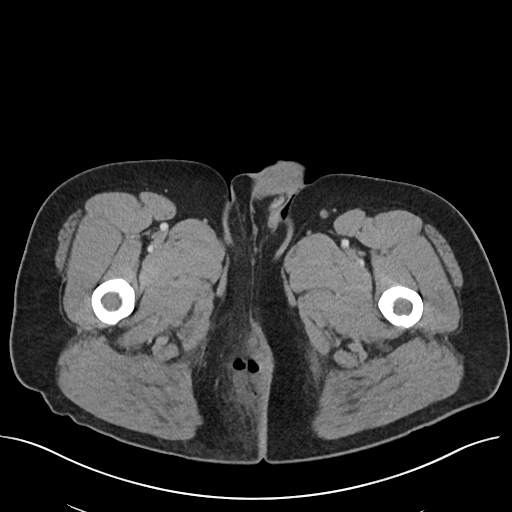
[im 19/65  soft-tissue]
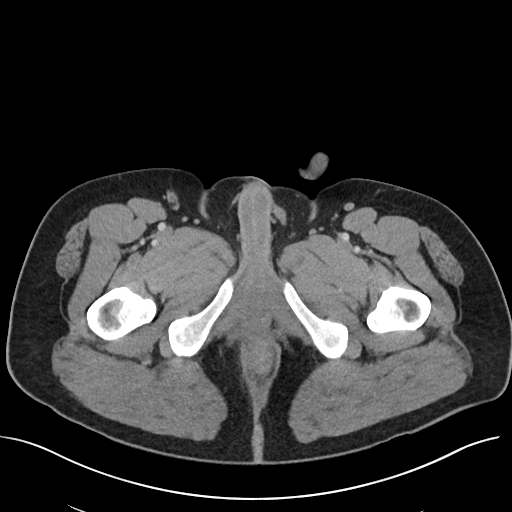
[im 23/65  soft-tissue]
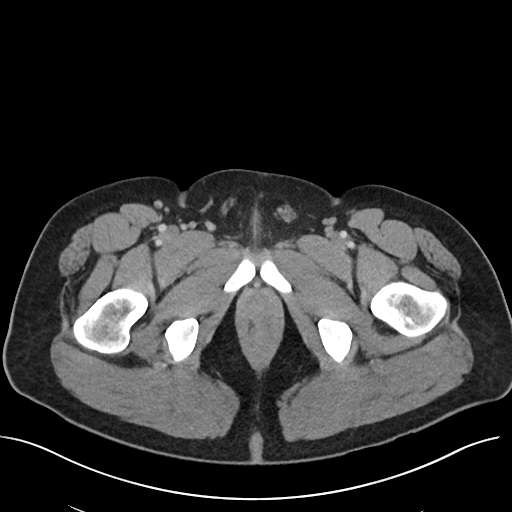
[im 27/65  soft-tissue]
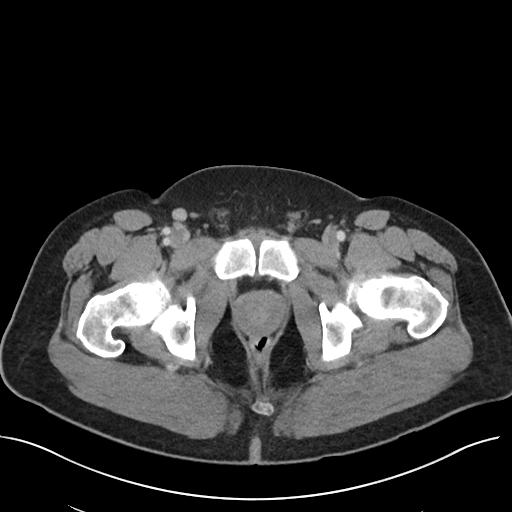
[im 34/65  soft-tissue]
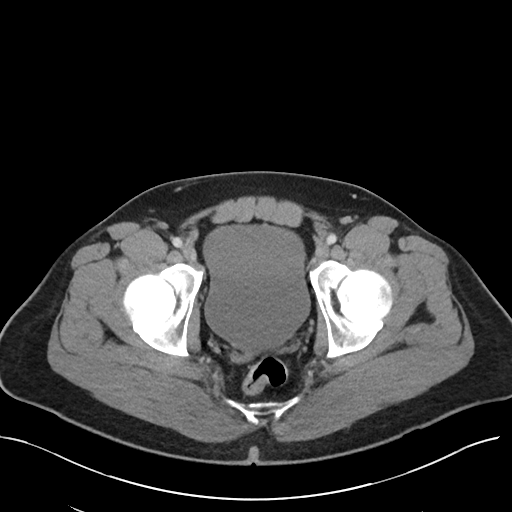
[im 38/65  soft-tissue]
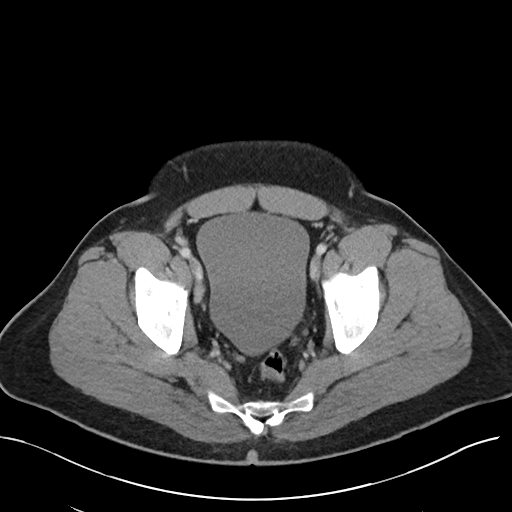
[im 42/65  soft-tissue]
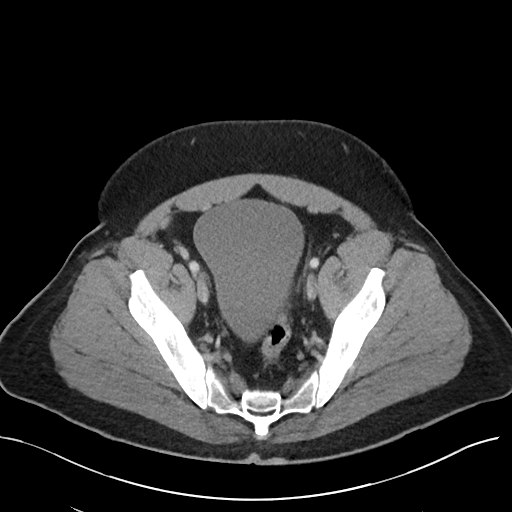
[im 42/65  bone]
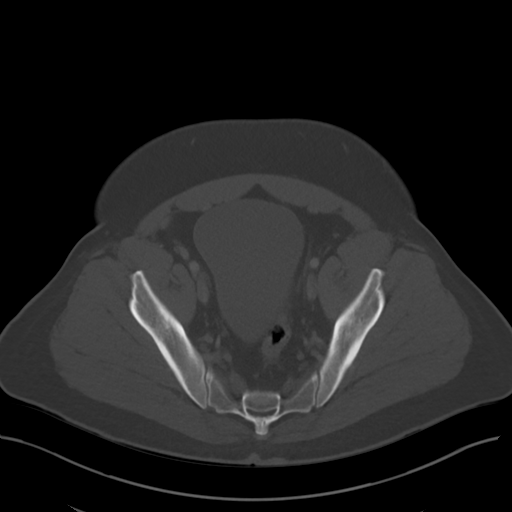
[im 46/65  soft-tissue]
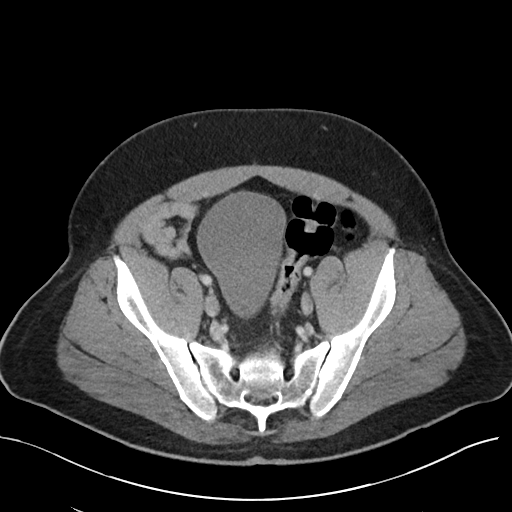
[im 52/65  soft-tissue]
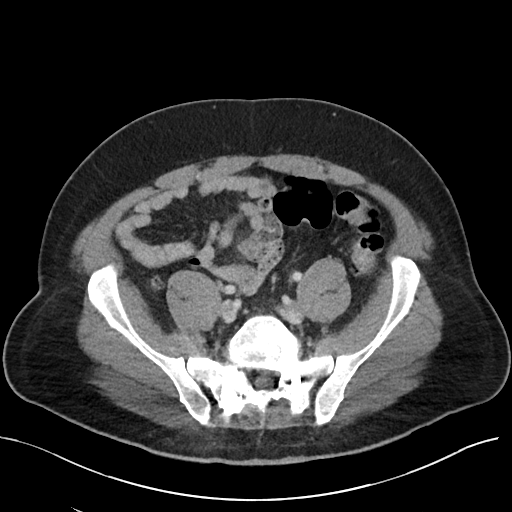
[im 56/65  soft-tissue]
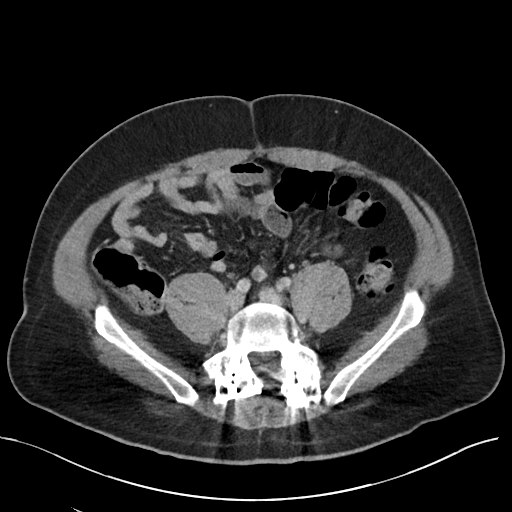
[im 60/65  soft-tissue]
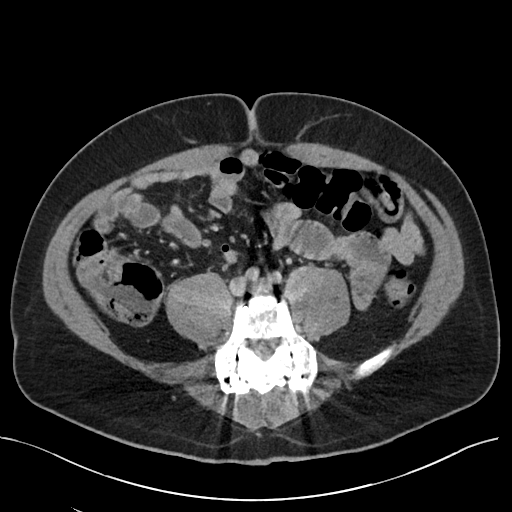

[Series 4: coronal · coronal · 0.63mm/px · 3 of 102 slices shown]
[im 34/102  soft-tissue]
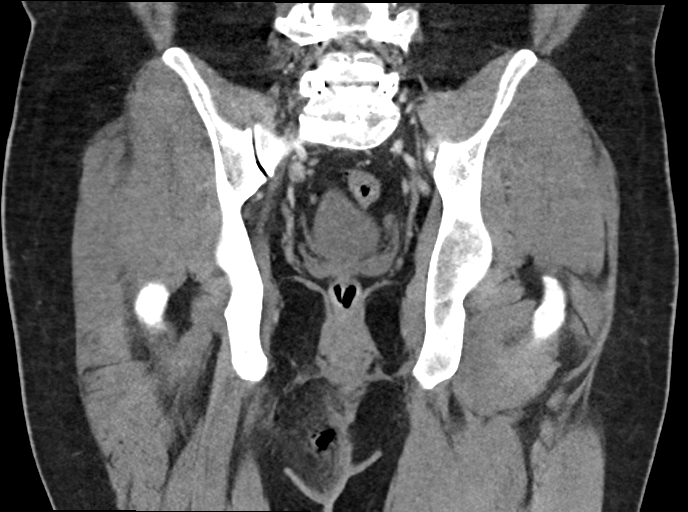
[im 45/102  soft-tissue]
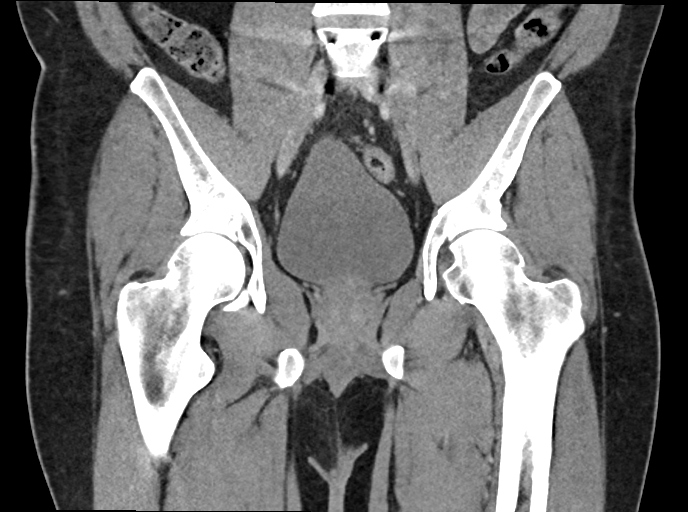
[im 57/102  soft-tissue]
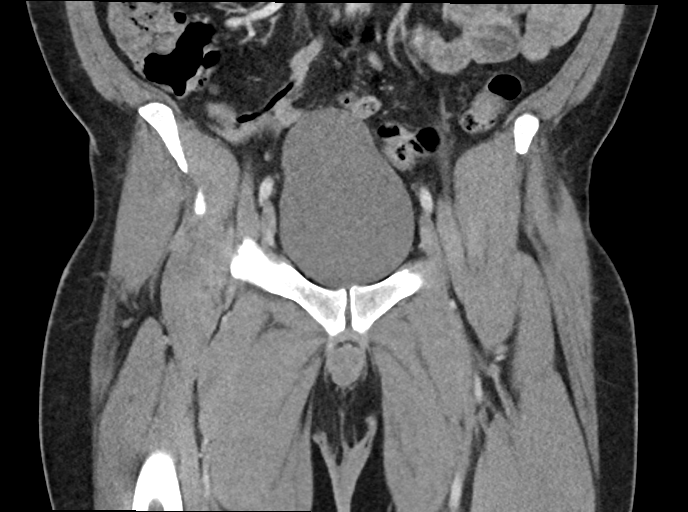

[16 of 46 positions shown; findings below may reference images not displayed]

FINDINGS: Urinary Tract: Distal ureters and urinary bladder are normal in
appearance.

Bowel: No pathologic dilatation of visualized portions of small
bowel or colon. Appendix is normal.

Vascular/Lymphatic: No pathologically enlarged lymph nodes. No
significant vascular abnormality seen.

Reproductive: Prostate gland and seminal vesicles are unremarkable
in appearance.

Other: There appears to be a trace amount of fluid adjacent to the
right-side of the distal rectum immediately before the anus,
extending from approximately [DATE] to [DATE], which extends caudally
and traverses the levator ani musculature. In the medial aspect of
the right ischioanal space (axial image 55 of series 2 and coronal
image 29 of series 4) there is a 5.6 x 2.7 x 4.9 cm gas and fluid
collection, compatible with an abscess. This may connect with the
skin surface in the medial right gluteal cleft.

Musculoskeletal: No aggressive appearing osseous lesions are noted
in the visualized portions of the skeleton. Status post PLIF at
L4-S1.
IMPRESSION: 1. Grade 4 transsphincteric perianal fistula with abscess, as above.

## 2022-06-08 IMAGING — CT CT PELVIS W/ CM
2 of 4 series · 16 of 46 positions shown, 18 images · IV contrast (APPLIED)
Comparison: March 27, 2021

CLINICAL DATA: Perirectal abscess.

EXAM:
CT PELVIS WITH CONTRAST
TECHNIQUE: Multidetector CT imaging of the pelvis was performed using the
standard protocol following the bolus administration of intravenous
contrast.
CONTRAST:  100mL OMNIPAQUE IOHEXOL 300 MG/ML  SOLN

[Series 2: pelvis w · axial · 0.88mm/px · z∈[+670,+965]mm · 13 of 69 slices shown, 15 images]
[im 5/69  soft-tissue]
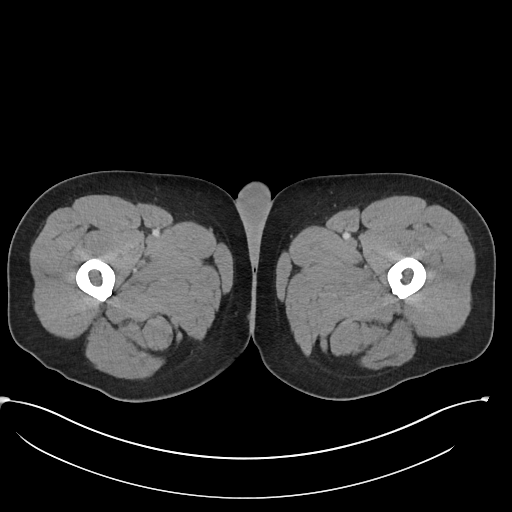
[im 5/69  bone]
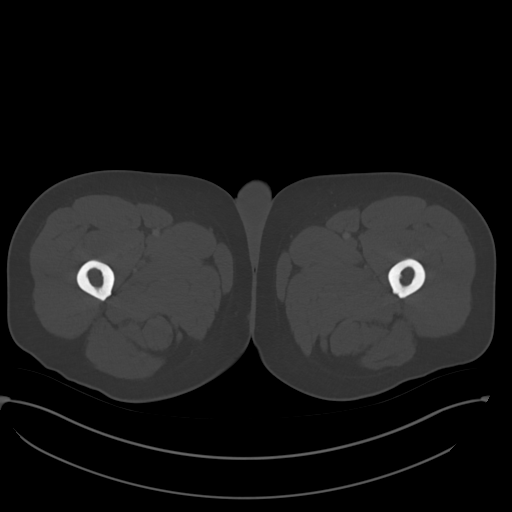
[im 10/69  soft-tissue]
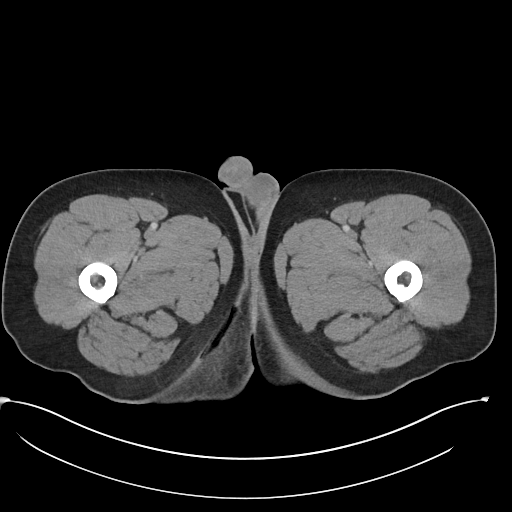
[im 14/69  soft-tissue]
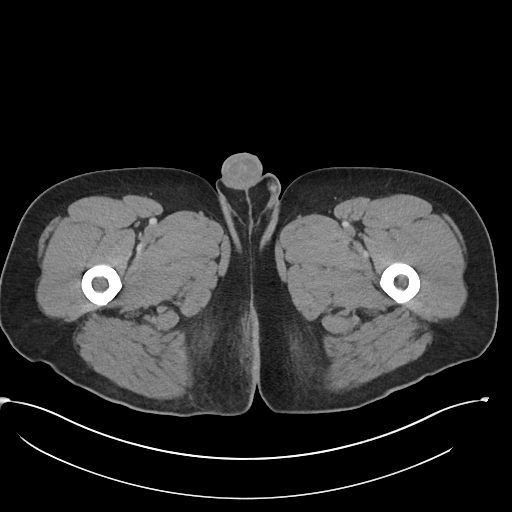
[im 19/69  soft-tissue]
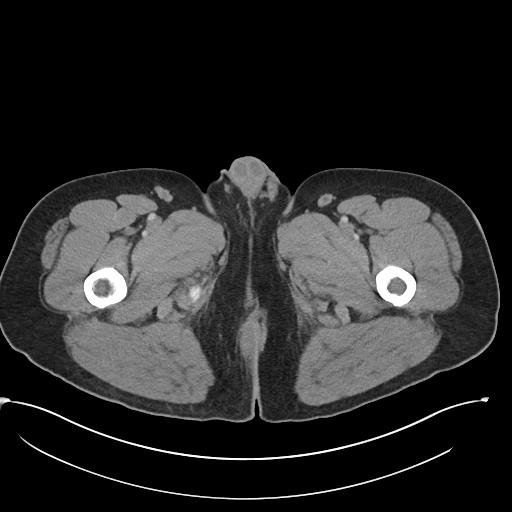
[im 23/69  soft-tissue]
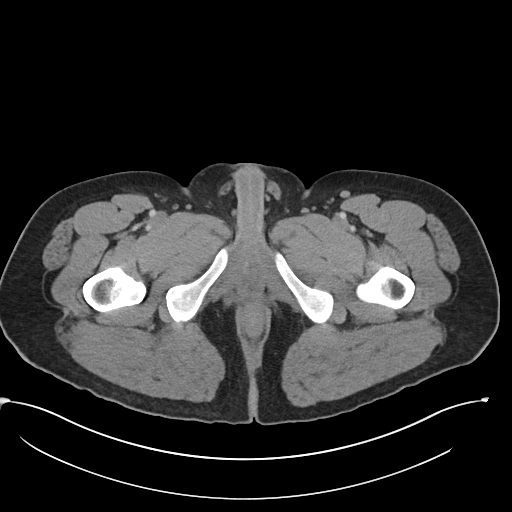
[im 28/69  soft-tissue]
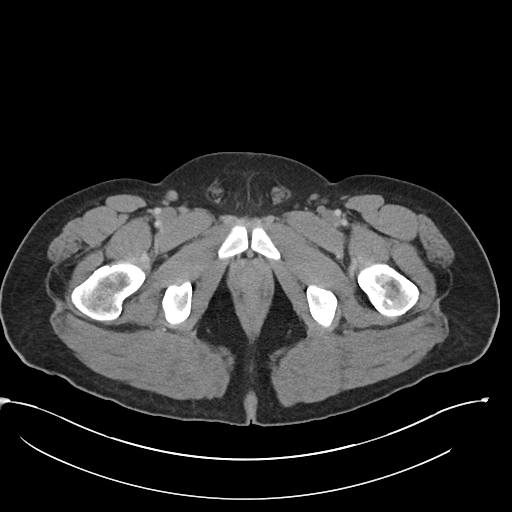
[im 37/69  soft-tissue]
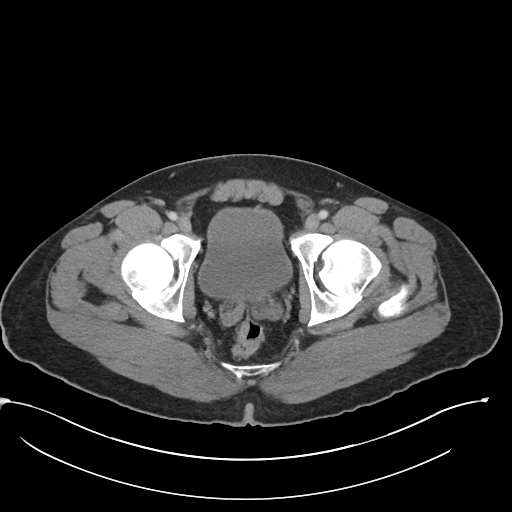
[im 41/69  soft-tissue]
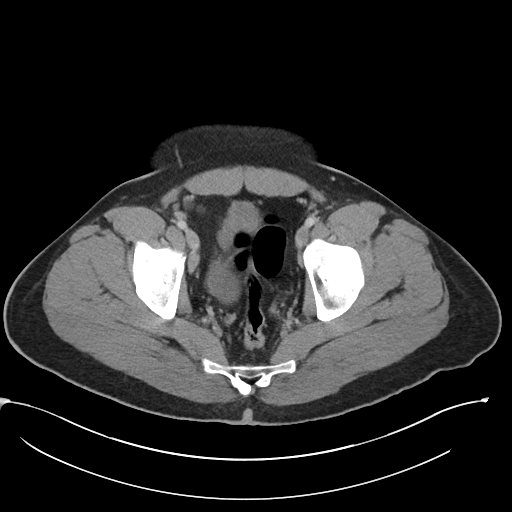
[im 46/69  soft-tissue]
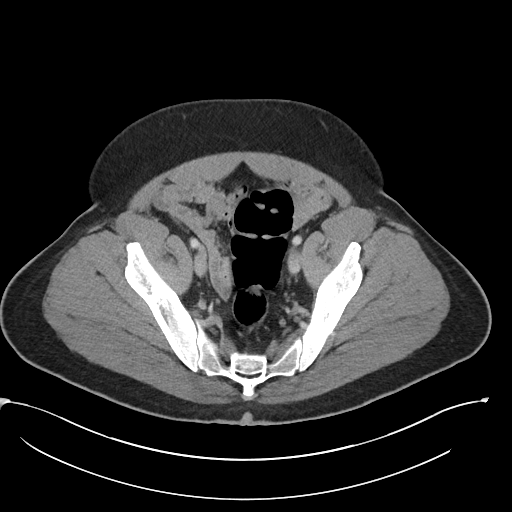
[im 46/69  bone]
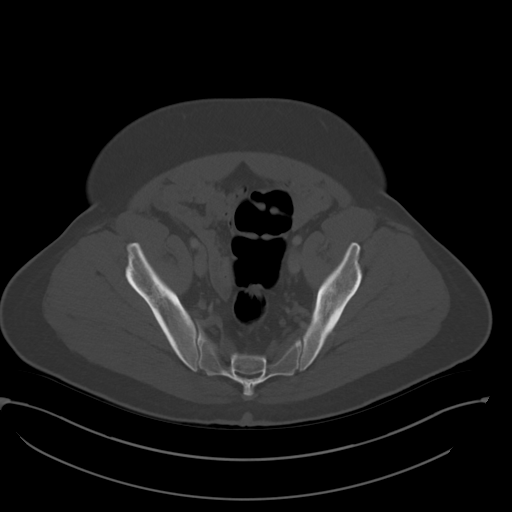
[im 50/69  soft-tissue]
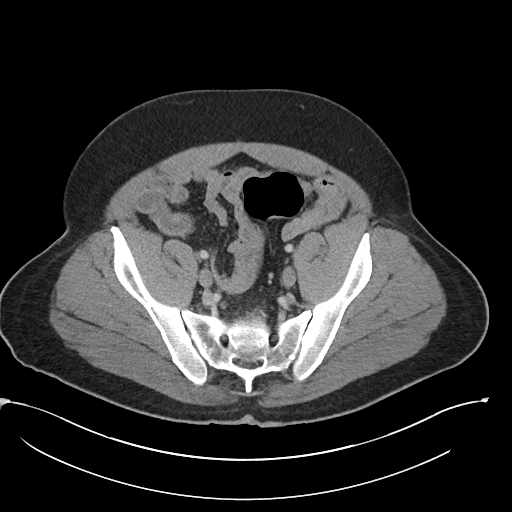
[im 55/69  soft-tissue]
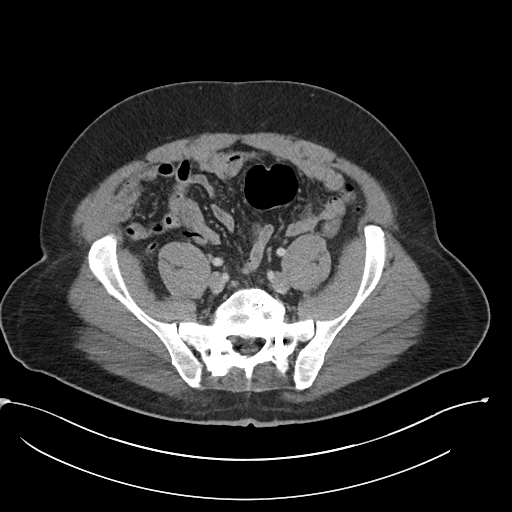
[im 59/69  soft-tissue]
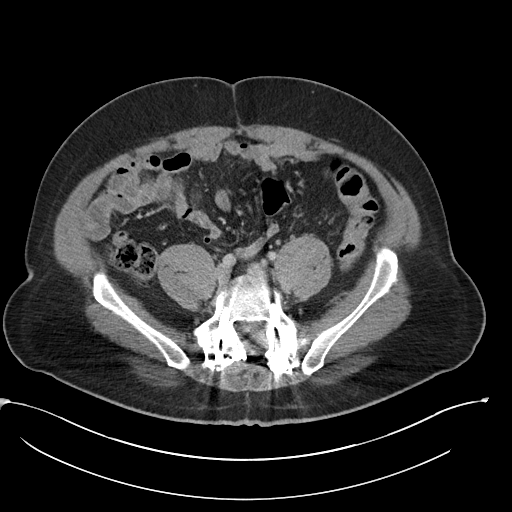
[im 64/69  soft-tissue]
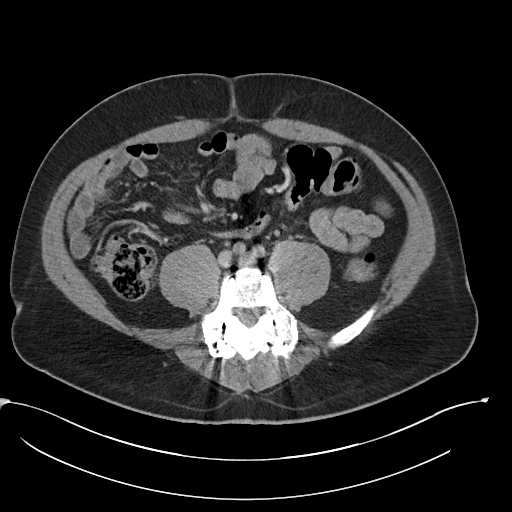

[Series 4: coronal · coronal · 0.67mm/px · 3 of 104 slices shown]
[im 35/104  soft-tissue]
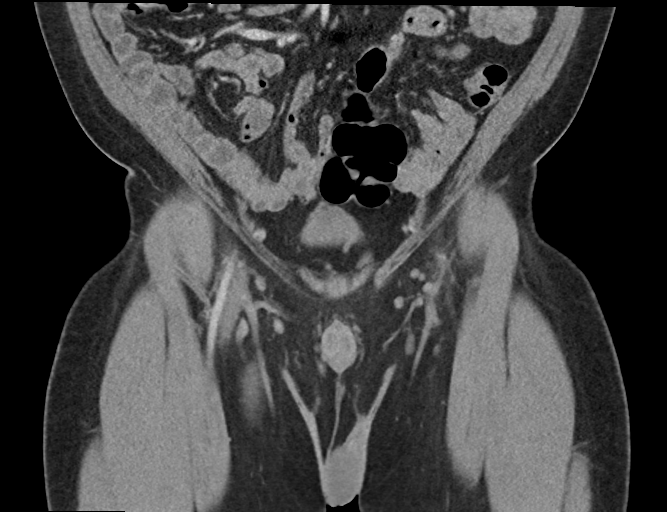
[im 46/104  soft-tissue]
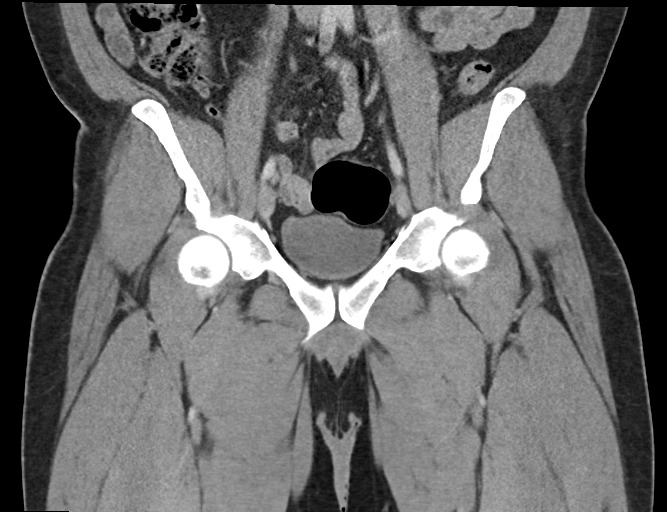
[im 58/104  soft-tissue]
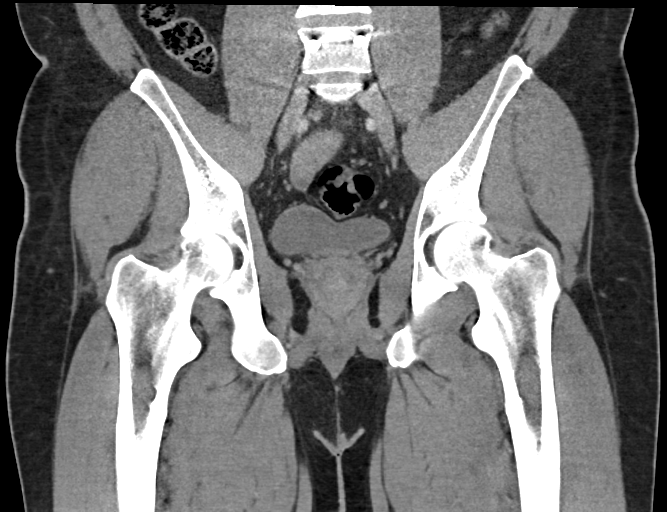

[16 of 46 positions shown; findings below may reference images not displayed]

FINDINGS: Urinary Tract:  No abnormality visualized.

Bowel:  Unremarkable visualized pelvic bowel loops.

Vascular/Lymphatic: No pathologically enlarged lymph nodes. No
significant vascular abnormality seen.

Reproductive:  No mass or other significant abnormality

Other:  None.

Musculoskeletal: A 2.9 cm x 1.1 cm area of low attenuation
(approximately 42.05 Hounsfield units) is seen within the
subcutaneous fat along the posterior perirectal region on the right
(axial CT image 51, CT series 2). A very mild amount of adjacent
inflammatory fat stranding is seen. A trace amount of air is seen
within the subcutaneous fat. This is markedly decreased in severity
when compared to the prior exam.

Bilateral metallic density pedicle screws are seen at the levels of
L4, L5 and S1.
IMPRESSION: 1. Small, posterior right-sided perirectal abscess, markedly
decreased in severity when compared to the prior exam.
2. Postoperative changes within the lower lumbar spine.
